# Patient Record
Sex: Female | Born: 1985
Health system: Southern US, Community
[De-identification: ages and names within clinical notes are randomized; demographics above are authoritative.]

## PROBLEM LIST (undated history)

## (undated) DIAGNOSIS — Z789 Other specified health status: Secondary | ICD-10-CM

## (undated) DIAGNOSIS — IMO0001 Reserved for inherently not codable concepts without codable children: Secondary | ICD-10-CM

## (undated) DIAGNOSIS — N83209 Unspecified ovarian cyst, unspecified side: Secondary | ICD-10-CM

## (undated) HISTORY — DX: Reserved for inherently not codable concepts without codable children: IMO0001

## (undated) HISTORY — DX: Unspecified ovarian cyst, unspecified side: N83.209

## (undated) HISTORY — PX: APPENDECTOMY: SHX54

## (undated) HISTORY — PX: WRIST SURGERY: SHX841

## (undated) HISTORY — PX: WISDOM TOOTH EXTRACTION: SHX21

---
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
## 2008-08-03 ENCOUNTER — Emergency Department (HOSPITAL_COMMUNITY): Admission: EM | Admit: 2008-08-03 | Discharge: 2008-08-03 | Payer: Self-pay | Admitting: Family Medicine

## 2010-01-12 ENCOUNTER — Ambulatory Visit (HOSPITAL_COMMUNITY): Admission: RE | Admit: 2010-01-12 | Discharge: 2010-01-12 | Payer: Self-pay | Admitting: Obstetrics and Gynecology

## 2010-04-25 ENCOUNTER — Inpatient Hospital Stay (HOSPITAL_COMMUNITY)
Admission: AD | Admit: 2010-04-25 | Discharge: 2010-04-28 | Payer: Self-pay | Source: Home / Self Care | Admitting: Obstetrics and Gynecology

## 2010-11-25 LAB — CBC
HCT: 29 % — ABNORMAL LOW (ref 36.0–46.0)
HCT: 35.9 % — ABNORMAL LOW (ref 36.0–46.0)
Hemoglobin: 12.1 g/dL (ref 12.0–15.0)
Hemoglobin: 9.9 g/dL — ABNORMAL LOW (ref 12.0–15.0)
MCH: 29.5 pg (ref 26.0–34.0)
MCH: 30.2 pg (ref 26.0–34.0)
MCHC: 33.8 g/dL (ref 30.0–36.0)
MCHC: 34.2 g/dL (ref 30.0–36.0)
MCV: 87.2 fL (ref 78.0–100.0)
MCV: 88.5 fL (ref 78.0–100.0)
Platelets: 241 10*3/uL (ref 150–400)
Platelets: 294 10*3/uL (ref 150–400)
RBC: 3.28 MIL/uL — ABNORMAL LOW (ref 3.87–5.11)
RBC: 4.12 MIL/uL (ref 3.87–5.11)
RDW: 14 % (ref 11.5–15.5)
RDW: 14.4 % (ref 11.5–15.5)
WBC: 14.6 10*3/uL — ABNORMAL HIGH (ref 4.0–10.5)
WBC: 8.3 10*3/uL (ref 4.0–10.5)

## 2010-11-25 LAB — RPR: RPR Ser Ql: NONREACTIVE

## 2011-01-27 NOTE — Discharge Summary (Signed)
[facility]  Patient:    Ashley Baldwin, Ashley Baldwin Visit Number: [id number] MRN: [id number]          Service Type: PED Location: PEDS [phone] Attending Physician:  Leonia Corona Dictated by:   Judie Petit. Leonia Corona, M.D. Admit Date:  04/16/2001 Discharge Date: 04/20/2001                             Discharge Summary  IDENTIFYING DATA:  A 25 year old female child.  ADMISSION DIAGNOSIS:  Right lower quadrant abdominal pain with possible appendicitis.  POSTOPERATIVE DIAGNOSIS:  Appendicular mass.  PROCEDURE PERFORMED:  Exploratory laparotomy with appendectomy.  BRIEF HISTORY AND PHYSICAL:  This 25 year old female patient was seen by Dr. Leeanne Mannan in his office for right lower quadrant abdominal pain of two-day duration. Subsequently, she was sent for CAT scan which showed appendicular mass for which she was admitted and reassessed. Physical examination was significant only for minimally tender right lower quadrant of the abdominal wall. Her past medical history was unremarkable. There was no dysuria, no menorrhagia, or dysmenorrhea. Based on the CAT scan findings of appendicular mass, which was acutely tender, we decided we would take the patient to the operating room for exploration and possible appendectomy, which was performed the same evening under general anesthesia. This was a very densely adherent appendix in appendicular phlegmon which was removed and then the peritoneum was irrigated and closed. The patient was sent to the pediatric floor for post appendectomy care where she remained n.p.o. for two days, and the postoperative recovery and course during the hospital stay was unremarkable. She was started with oral liquids after 24 hours which was gradually advanced. She was ambulatory the next morning. On the day of discharge, the third postoperative day, she was afebrile, minimally tender over the surgical incision. The dressing was clean and dry.  She was able to tolerate soft diet and was ambulatory and she was sent home with antibiotics and pain medication with instruction to follow up in Dr. Rosalee Kaufman office in 10 days.  DISCHARGE MEDICATIONS:  Augmentin 875 mg p.o. b.i.d. for seven days.  DIET:  Regular.  DISCHARGE INSTRUCTIONS:  The patient is to avoid heavy exercise for three weeks. Dictated by:   Judie Petit. Leonia Corona, M.D. Attending Physician:  Leonia Corona DD:  05/27/01 TD:  05/27/01 Job: [id number]

## 2011-01-27 NOTE — Op Note (Signed)
[place]  Patient:    Ashley Baldwin, Ashley Baldwin                    MRN: [id number] Proc. Date: 04/26/01 Adm. Date:  [id number] Disc. Date: [id number] Attending:  Leonia Corona CC:         Velora Heckler, M.D.  Maida Sale, P.A., [facility], [address] [place] [address]   Operative Report  PATIENT IDENTIFICATION:  A 25 year old female child.  PREOPERATIVE DIAGNOSIS:   Appendicular mass.  POSTOPERATIVE DIAGNOSIS:  Complex appendicular mass.  OPERATION:  Open appendectomy.  SURGEON:  Nelida Meuse, M.D.  ASSISTANT:  Velora Heckler, M.D.  ANESTHESIA:  General endotracheal tube anesthesia.  INDICATIONS FOR THE PROCEDURE:  This 25 year old severely obese girl presented with right lower quadrant abdominal pain where clinical examination was equivocal for acute appendicitis.  A CT scan was obtained which showed a very complex, tender mass in the region of the ileocecal junction without well delineating appendix.  In view of the high white count and acutely tender mass, we decided to do an open appendectomy as the procedure.  PROCEDURE IN DETAIL:  The patient was brought to the operating room and placed supine on the operating table.  General endotracheal tube anesthesia is given. The right lower quadrant of the abdomen and surrounding area was cleaned, prepped, and draped in the usual manner.  The mass was palpated before making the right lower quadrant.  The incision was placed, centered at [facility] point.  Curvilinear skin crease incision was made.  Initially the incision was about 7 to 8 cm long on the skin.  The incision was deepened through the subcutaneous tissue using electrocautery.  A very thick pad of fat, several inches thick, had to be split with the help of blades of retractors until the external aponeurotic fascia was reached which was incised along the line of its fibers.  Then the internal oblique and transverse  abdominus muscle fibers were split along the fibers by inserting the blunt hemostat and stretching the fibers and pulling them apart with the help of blades of [employer] retractors. Upon visualizing the peritoneum, further stretching of the muscle fiber was done, and a limited access was obtained to the peritoneal cavity by dividing the peritoneum between two clamps and dividing it along the line of incision. Then, retractors were inserted inside the peritoneal cavity, and the mass was visualized.  It was a very hard, thickened mass measuring more than 10 cm transversely as well as vertically.  Finger dissection was attempted through this limited incision; however, it apparently did not appear to make any headway due to a very firm consistency and unidentifiable structures.  There was a very large tubular structure adherent to the lateral wall of the parietal peritoneum on the right side which lay behind the cecum which was already thickened, possibly appearing as tubular structure as an appendix. However, with this limited access, further progression was impossible.  I decided to divide the muscle.  The internal oblique and transverse abdominus muscles were divided with the help of electrocautery medially as well as laterally to gain more access; so this muscle splitting McBurney incision was converted into muscle cutting Rockey-Davis incision, and a better access was obtained.  With the help of a finger dissection, the adherent mass was separated from the lateral wall, and our finger was passed above the mass to reach the end of the tubular structure which was palpably cystic and  appeared to have a blind end indicating possibility of this being the appendix. However, the diameter was too wide, more than 2 cm, to easily accept this assumption.  We continued our blunt dissection with fingers and peanuts behind this assumed appendicular structure where another tubular structure was  identified which appeared like a small bowel, a very unusually placed loop of small bowel behind the appendix in the retroperitoneal area was noticed.  We were able to continue slowly but steadily dissecting the appendicular structure up until the base.  The entire appendix was more than 10 cm long and more than 4 cm in diameter.  After completing the dissection of the appendix up to the base, the mesoappendix was divided with electrocautery which had already thrombosed vessels.  At the base of the appendix, the communication with the cecum was very wide, more than 2 to 3 cm.  Therefore, we decided to use a TIA stapler which was fired at the base, and then the appendix was divided and removed from the field.  The rest of the structures were left without any further dissection.  The cecal wall was very thickened and appeared as it it were calcified.  Medially, the mass appeared to be conglomeration of the terminal ileum, cecal wall, and much of the omentum; however, there was no obvious obstruction.  We, therefore, decided to do no further dissection of the mass. Local irrigation with warm saline was done, and oozing and bleeding spots were cauterized.  Then the abdominal cavity was closed in multiple layers, the peritoneum with 0 Vicryl continuous stitches.  The muscles were approximated with 0 Vicryl interrupted stitches, and the external aponeurosis was repaired with interrupted sutures using 0 Vicryl.  The wound was irrigated once again. The skin was closed with #1 nylon interrupted stitches.  The patient tolerated the procedure very well which was smooth and uneventful.  The patient was later extubated and transported to the recovery room in good and stable condition. DD:  04/26/01 TD:  04/27/01 Job: [id number]

## 2012-11-04 ENCOUNTER — Encounter: Payer: Self-pay | Admitting: Family Medicine

## 2012-11-04 ENCOUNTER — Ambulatory Visit (INDEPENDENT_AMBULATORY_CARE_PROVIDER_SITE_OTHER): Payer: 59 | Admitting: Family Medicine

## 2012-11-04 DIAGNOSIS — Z0000 Encounter for general adult medical examination without abnormal findings: Secondary | ICD-10-CM

## 2012-11-04 DIAGNOSIS — Z136 Encounter for screening for cardiovascular disorders: Secondary | ICD-10-CM

## 2012-11-04 DIAGNOSIS — L659 Nonscarring hair loss, unspecified: Secondary | ICD-10-CM

## 2012-11-04 LAB — LIPID PANEL
LDL Cholesterol: 90 mg/dL (ref 0–99)
Total CHOL/HDL Ratio: 3
VLDL: 7.6 mg/dL (ref 0.0–40.0)

## 2012-11-04 LAB — CBC WITH DIFFERENTIAL/PLATELET
Basophils Relative: 0.4 % (ref 0.0–3.0)
Eosinophils Relative: 1.8 % (ref 0.0–5.0)
HCT: 36.4 % (ref 36.0–46.0)
Hemoglobin: 12.1 g/dL (ref 12.0–15.0)
Lymphs Abs: 2.4 10*3/uL (ref 0.7–4.0)
Monocytes Relative: 8.6 % (ref 3.0–12.0)
Neutro Abs: 2.8 10*3/uL (ref 1.4–7.7)
WBC: 5.8 10*3/uL (ref 4.5–10.5)

## 2012-11-04 LAB — COMPREHENSIVE METABOLIC PANEL
AST: 17 U/L (ref 0–37)
Albumin: 3.8 g/dL (ref 3.5–5.2)
Alkaline Phosphatase: 61 U/L (ref 39–117)
BUN: 10 mg/dL (ref 6–23)
Potassium: 3.6 mEq/L (ref 3.5–5.1)
Sodium: 139 mEq/L (ref 135–145)
Total Protein: 7.8 g/dL (ref 6.0–8.3)

## 2012-11-04 LAB — T4, FREE: Free T4: 0.84 ng/dL (ref 0.60–1.60)

## 2012-11-04 NOTE — Progress Notes (Signed)
Subjective:    Patient ID: Ashley Baldwin, female    DOB: 03-18-1986, 27 y.o.   MRN: [id number]  HPI  Very pleasant 27 yo female here to establish care.  Weight gain- has gained 20-30 pounds over past 6 months yet she is actually eating better and exercising more regularly. Weighed 215 prior to birth of her 68 year old daughter.  Has implanon for [place].  Weight: 255 lb (115.667 kg)   She has noticed some increased fatigue and patches of her hair falling out.  No changes in her bowel habits.  No family h/o thyroid dysfunction that she is aware of.  Sees OBGYN, Dr. Ellyn Hack, last pap smear was in 11/2011.  No h/o abnormal pap smears.  Patient Active Problem List  Diagnosis  . Severe obesity (BMI >= 40)  . Alopecia   No past medical history on file. Past Surgical History  Procedure Laterality Date  . Appendectomy     History  Substance Use Topics  . Smoking status: Never Smoker   . Smokeless tobacco: Not on file  . Alcohol Use: Not on file   Family History  Problem Relation Age of Onset  . Hypertension Mother   . Diabetes Father   . Hypertension Father   . Cancer Maternal Grandfather     prostate CA   No Known Allergies No current outpatient prescriptions on file prior to visit.   No current facility-administered medications on file prior to visit.   The PMH, PSH, Social History, Family History, Medications, and allergies have been reviewed in [facility], and have been updated if relevant.    Review of Systems    See HPI No depression No CP  No SOB  Objective:   Physical Exam BP 118/80  Pulse 80  Temp(Src) 98.2 F (36.8 C)  Ht 5' 2.5" (1.588 m)  Wt 255 lb (115.667 kg)  BMI 45.87 kg/m2  General:  Obese, Well-developed,well-nourished,in no acute distress; alert,appropriate and cooperative throughout examination Head:  normocephalic and atraumatic.   Eyes:  vision grossly intact, pupils equal, pupils round, and pupils reactive to light.   Ears:  R ear normal  and L ear normal.   Nose:  no external deformity.   Mouth:  good dentition.   Neck:  No deformities, masses, or tenderness noted. Lungs:  Normal respiratory effort, chest expands symmetrically. Lungs are clear to auscultation, no crackles or wheezes. Heart:  Normal rate and regular rhythm. S1 and S2 normal without gallop, murmur, click, rub or other extra sounds. Abdomen:  Bowel sounds positive,abdomen soft and non-tender without masses, organomegaly or hernias noted. Msk:  No deformity or scoliosis noted of thoracic or lumbar spine.   Extremities:  No clubbing, cyanosis, edema, or deformity noted with normal full range of motion of all joints.   Neurologic:  alert & oriented X3 and gait normal.   Skin:  Several patches of alopecia on scalp Cervical Nodes:  No lymphadenopathy noted Axillary Nodes:  No palpable lymphadenopathy Psych:  Cognition and judgment appear intact. Alert and cooperative with normal attention span and concentration. No apparent delusions, illusions, hallucinations  Assessment and Plan: 1. Severe obesity (BMI >= 40) Deteriorated.  Will check labs to rule thyroid dysfunction and other possible factors.  Discussed diet (food journal) and exercise.  Would consider trial of phentermine if labs ok- discussed risks including, HTN, pulmonary HTN. - TSH - T4, Free - CBC with Differential - Comprehensive metabolic panel  2. Screening for ischemic heart disease  - Lipid  Panel   3. Alopecia See above.  If labs normal, refer to derm. The patient indicates understanding of these issues and agrees with the plan.

## 2012-11-04 NOTE — Patient Instructions (Addendum)
It was really nice to meet you. We will call you with your lab results.

## 2012-11-05 ENCOUNTER — Encounter: Payer: Self-pay | Admitting: *Deleted

## 2012-12-17 ENCOUNTER — Encounter: Payer: Self-pay | Admitting: Family Medicine

## 2013-01-14 ENCOUNTER — Encounter: Payer: Self-pay | Admitting: Family Medicine

## 2013-01-14 ENCOUNTER — Ambulatory Visit (INDEPENDENT_AMBULATORY_CARE_PROVIDER_SITE_OTHER): Payer: 59 | Admitting: Family Medicine

## 2013-01-14 MED ORDER — PHENTERMINE HCL 15 MG PO CAPS: 15.0000 mg | ORAL_CAPSULE | ORAL | Status: DC

## 2013-01-14 NOTE — Patient Instructions (Signed)
Good to see you. Please take phentermine in the morning, daily, no later than 11 am. Please call me immediately if you have any CP, SOB or palpitations. Follow up with me in 1 month, sooner if you have any symptoms we discussed today.

## 2013-01-14 NOTE — Progress Notes (Signed)
  Subjective:    Patient ID: Ashley Baldwin, female    DOB: 1986/06/23, 27 y.o.   MRN: [id number]  HPI  Very pleasant 27 yo female here to follow up obesity.  When she established care in February, we discussed her struggle with obesity.  Weight gain- has gained 20-30 pounds over past 9 yet she is actually eating better and exercising more regularly. Weighed 215 prior to birth of her 72 year old daughter.  Wt Readings from Last 3 Encounters:  01/14/13 258 lb 8 oz (117.255 kg)  11/04/12 255 lb (115.667 kg)     Has implanon for [facility].     Lab Results  Component Value Date   TSH 1.35 11/04/2012   Lab Results  Component Value Date   NA 139 11/04/2012   K 3.6 11/04/2012   CL 105 11/04/2012   CO2 27 11/04/2012   Lab Results  Component Value Date   WBC 5.8 11/04/2012   HGB 12.1 11/04/2012   HCT 36.4 11/04/2012   MCV 85.8 11/04/2012   PLT 350.0 11/04/2012       Patient Active Problem List   Diagnosis Date Noted  . Severe obesity (BMI >= 40) 11/04/2012  . Alopecia 11/04/2012   No past medical history on file. Past Surgical History  Procedure Laterality Date  . Appendectomy     History  Substance Use Topics  . Smoking status: Never Smoker   . Smokeless tobacco: Not on file  . Alcohol Use: Not on file   Family History  Problem Relation Age of Onset  . Hypertension Mother   . Diabetes Father   . Hypertension Father   . Cancer Maternal Grandfather     prostate CA   No Known Allergies Current Outpatient Prescriptions on File Prior to Visit  Medication Sig Dispense Refill  . Multiple Vitamin (MULTIVITAMIN) tablet Take 1 tablet by mouth daily.       No current facility-administered medications on file prior to visit.   The PMH, PSH, Social History, Family History, Medications, and allergies have been reviewed in [facility], and have been updated if relevant.    Review of Systems    See HPI No depression No CP  No SOB  Objective:   Physical Exam BP 120/80  Pulse 76   Temp(Src) 98.4 F (36.9 C) (Oral)  Wt 258 lb 8 oz (117.255 kg)  BMI 46.5 kg/m2  LMP 12/22/2012  General:  Obese, Well-developed,well-nourished,in no acute distress; alert,appropriate and cooperative throughout examination Head:  normocephalic and atraumatic.   Eyes:  vision grossly intact, pupils equal, pupils round, and pupils reactive to light.   Ears:  R ear normal and L ear normal.   Nose:  no external deformity.   Mouth:  good dentition.   Neck:  No deformities, masses, or tenderness noted. Lungs:  Normal respiratory effort, chest expands symmetrically. Lungs are clear to auscultation, no crackles or wheezes. Heart:  Normal rate and regular rhythm. S1 and S2 normal without gallop, murmur, click, rub or other extra sounds. Psych:  Cognition and judgment appear intact. Alert and cooperative with normal attention span and concentration. No apparent delusions, illusions, hallucinations  Assessment and Plan: 1. Severe obesity (BMI >= 40) Deteriorated.  Discussed diet (food journal) and exercise.  VSS, normotensive. Discussed risks of phentermine including, HTN, CVA and pulmonary HTN.  She would like to try it. Rx given.  Follow up in one month.

## 2013-02-17 ENCOUNTER — Ambulatory Visit (INDEPENDENT_AMBULATORY_CARE_PROVIDER_SITE_OTHER): Payer: 59 | Admitting: Family Medicine

## 2013-02-17 ENCOUNTER — Encounter: Payer: Self-pay | Admitting: Family Medicine

## 2013-02-17 DIAGNOSIS — L659 Nonscarring hair loss, unspecified: Secondary | ICD-10-CM

## 2013-02-17 MED ORDER — PHENTERMINE HCL 30 MG PO CAPS: ORAL_CAPSULE | ORAL | Status: DC

## 2013-02-17 NOTE — Patient Instructions (Addendum)
Good to see you. Congratulations on your weight loss- keep working on it. Continue with food journal.  Please follow up in 1 month.  Please stop by to see Shirlee Limerick on your way out to set up your referral.

## 2013-02-17 NOTE — Progress Notes (Signed)
  Subjective:    Patient ID: Ashley Baldwin, female    DOB: [birth year], 27 y.o.   MRN: [id number]  HPI  Very pleasant 27 yo female here to follow up obesity.    This has been a lifelong struggle for [name].  Last month, we started phentermine 15 mg daily. Denies any HA, palpitations, blurred vision, CP or SOB.  She has lost 2 pounds.  According to her home scale, has lost 4 pounds.  She has noticed decreased appetite.   Has implanon for [facility].     Lab Results  Component Value Date   TSH 1.35 11/04/2012   Lab Results  Component Value Date   NA 139 11/04/2012   K 3.6 11/04/2012   CL 105 11/04/2012   CO2 27 11/04/2012   Lab Results  Component Value Date   WBC 5.8 11/04/2012   HGB 12.1 11/04/2012   HCT 36.4 11/04/2012   MCV 85.8 11/04/2012   PLT 350.0 11/04/2012    Still having issues with hair loss and would like referral to dermatologist.   Patient Active Problem List   Diagnosis Date Noted  . Severe obesity (BMI >= 40) 11/04/2012  . Alopecia 11/04/2012   No past medical history on file. Past Surgical History  Procedure Laterality Date  . Appendectomy     History  Substance Use Topics  . Smoking status: Never Smoker   . Smokeless tobacco: Never Used  . Alcohol Use: Yes     Comment: occassionally   Family History  Problem Relation Age of Onset  . Hypertension Mother   . Diabetes Father   . Hypertension Father   . Cancer Maternal Grandfather     prostate CA   No Known Allergies Current Outpatient Prescriptions on File Prior to Visit  Medication Sig Dispense Refill  . Multiple Vitamin (MULTIVITAMIN) tablet Take 1 tablet by mouth daily.      . phentermine 15 MG capsule Take 1 capsule (15 mg total) by mouth every morning.  30 capsule  0   No current facility-administered medications on file prior to visit.   The PMH, PSH, Social History, Family History, Medications, and allergies have been reviewed in [facility], and have been updated if relevant.    Review of  Systems    See HPI No depression No CP  No SOB  Objective:   Physical Exam Wt Readings from Last 3 Encounters:  02/17/13 256 lb (116.121 kg)  01/14/13 258 lb 8 oz (117.255 kg)  11/04/12 255 lb (115.667 kg)    General:  Obese, Well-developed,well-nourished,in no acute distress; alert,appropriate and cooperative throughout examination Head:  normocephalic and atraumatic.   Eyes:  vision grossly intact, pupils equal, pupils round, and pupils reactive to light.   Ears:  R ear normal and L ear normal.   Nose:  no external deformity.   Mouth:  good dentition.   Neck:  No deformities, masses, or tenderness noted. Lungs:  Normal respiratory effort, chest expands symmetrically. Lungs are clear to auscultation, no crackles or wheezes. Heart:  Normal rate and regular rhythm. S1 and S2 normal without gallop, murmur, click, rub or other extra sounds. Psych:  Cognition and judgment appear intact. Alert and cooperative with normal attention span and concentration. No apparent delusions, illusions, hallucinations  Assessment and Plan: 1. Severe obesity (BMI >= 40) Improving.  Increase phentermine to 30 mg daily. Follow up in 1 month.  2.  Alopecia- Referral placed to derm.

## 2013-03-19 ENCOUNTER — Encounter: Payer: Self-pay | Admitting: Family Medicine

## 2013-03-19 ENCOUNTER — Ambulatory Visit (INDEPENDENT_AMBULATORY_CARE_PROVIDER_SITE_OTHER): Payer: 59 | Admitting: Family Medicine

## 2013-03-19 MED ORDER — PHENTERMINE HCL 37.5 MG PO CAPS: 37.5000 mg | ORAL_CAPSULE | ORAL | Status: DC

## 2013-03-19 NOTE — Patient Instructions (Addendum)
Good to see you. Congratulations on your weight loss- keep working on it. Continue with food journal.  Please follow up in 1 month.

## 2013-03-19 NOTE — Progress Notes (Signed)
Subjective:    Patient ID: Ashley Baldwin, female    DOB: 1986-03-08, 27 y.o.   MRN: [id number]  HPI  Very pleasant 27 yo female here to follow up obesity.    This has been a lifelong struggle for Ashley Baldwin.  Increased phentermine to 30 mg daily last month.   Denies any HA, palpitations, blurred vision, CP or SOB. Has lost 5 pounds since last visit! Wt Readings from Last 3 Encounters:  03/19/13 251 lb (113.853 kg)  02/17/13 256 lb (116.121 kg)  01/14/13 258 lb 8 oz (117.255 kg)   She has noticed decreased appetite. Starting to exercise.  Has implanon for [facility].     Lab Results  Component Value Date   TSH 1.35 11/04/2012   Lab Results  Component Value Date   NA 139 11/04/2012   K 3.6 11/04/2012   CL 105 11/04/2012   CO2 27 11/04/2012   Lab Results  Component Value Date   WBC 5.8 11/04/2012   HGB 12.1 11/04/2012   HCT 36.4 11/04/2012   MCV 85.8 11/04/2012   PLT 350.0 11/04/2012    Still having issues with hair loss and would like referral to dermatologist.   Patient Active Problem List   Diagnosis Date Noted  . Severe obesity (BMI >= 40) 11/04/2012  . Alopecia 11/04/2012   No past medical history on file. Past Surgical History  Procedure Laterality Date  . Appendectomy     History  Substance Use Topics  . Smoking status: Never Smoker   . Smokeless tobacco: Never Used  . Alcohol Use: Yes     Comment: occassionally   Family History  Problem Relation Age of Onset  . Hypertension Mother   . Diabetes Father   . Hypertension Father   . Cancer Maternal Grandfather     prostate CA   No Known Allergies Current Outpatient Prescriptions on File Prior to Visit  Medication Sig Dispense Refill  . Multiple Vitamin (MULTIVITAMIN) tablet Take 1 tablet by mouth daily.      . phentermine 30 MG capsule 1 tab by mouth every morning.  30 capsule  0   No current facility-administered medications on file prior to visit.   The PMH, PSH, Social History, Family History,  Medications, and allergies have been reviewed in [facility], and have been updated if relevant.    Review of Systems    See HPI No depression No CP  No SOB  Objective:   Physical Exam BP 110/72  Pulse 80  Temp(Src) 98.2 F (36.8 C)  Wt 251 lb (113.853 kg)  BMI 45.15 kg/m2  Wt Readings from Last 3 Encounters:  02/17/13 256 lb (116.121 kg)  01/14/13 258 lb 8 oz (117.255 kg)  11/04/12 255 lb (115.667 kg)    General:  Obese, Well-developed,well-nourished,in no acute distress; alert,appropriate and cooperative throughout examination Head:  normocephalic and atraumatic.   Eyes:  vision grossly intact, pupils equal, pupils round, and pupils reactive to light.   Ears:  R ear normal and L ear normal.   Nose:  no external deformity.   Mouth:  good dentition.   Neck:  No deformities, masses, or tenderness noted. Lungs:  Normal respiratory effort, chest expands symmetrically. Lungs are clear to auscultation, no crackles or wheezes. Heart:  Normal rate and regular rhythm. S1 and S2 normal without gallop, murmur, click, rub or other extra sounds. Psych:  Cognition and judgment appear intact. Alert and cooperative with normal attention span and concentration. No apparent delusions, illusions, hallucinations  Assessment and Plan: 1. Severe obesity (BMI >= 40) Improving.  Increase phentermine to 37.5 mg daily. Follow up in 1 month.

## 2013-04-25 ENCOUNTER — Ambulatory Visit: Payer: 59 | Admitting: Family Medicine

## 2013-04-25 DIAGNOSIS — Z0289 Encounter for other administrative examinations: Secondary | ICD-10-CM

## 2013-07-17 ENCOUNTER — Other Ambulatory Visit: Payer: Self-pay

## 2014-03-16 ENCOUNTER — Encounter: Payer: Self-pay | Admitting: Family Medicine

## 2014-03-16 ENCOUNTER — Ambulatory Visit: Payer: 59 | Admitting: Family Medicine

## 2014-03-16 ENCOUNTER — Ambulatory Visit (INDEPENDENT_AMBULATORY_CARE_PROVIDER_SITE_OTHER): Payer: 59 | Admitting: Family Medicine

## 2014-03-16 VITALS — BP 106/70 | HR 78 | Temp 98.6°F | Ht 62.25 in | Wt 280.5 lb

## 2014-03-16 DIAGNOSIS — J069 Acute upper respiratory infection, unspecified: Secondary | ICD-10-CM | POA: Insufficient documentation

## 2014-03-16 MED ORDER — HYDROCOD POLST-CHLORPHEN POLST 10-8 MG/5ML PO LQCR: 5.0000 mL | Freq: Every evening | ORAL | Status: DC | PRN

## 2014-03-16 MED ORDER — AZITHROMYCIN 250 MG PO TABS: ORAL_TABLET | ORAL | Status: DC

## 2014-03-16 NOTE — Patient Instructions (Signed)
Good to see you. You likely have bronchitis. Take zpack as directed. Call me if you decide you want an inhaler.  Drink warm tea with honey.  Tussionex as needed at bedtime.

## 2014-03-16 NOTE — Progress Notes (Signed)
Pre visit review using our clinic review tool, if applicable. No additional management support is needed unless otherwise documented below in the visit note. 

## 2014-03-16 NOTE — Progress Notes (Signed)
SUBJECTIVE:  Ashley Baldwin is a 28 y.o. female who complains of coryza, congestion, dry cough, sneezing and sore throat for 10 days. She denies a history of anorexia, chest pain, chills and shortness of breath and has a history of asthma. Patient denies smoke cigarettes.   Current Outpatient Prescriptions on File Prior to Visit  Medication Sig Dispense Refill  . Multiple Vitamin (MULTIVITAMIN) tablet Take 1 tablet by mouth daily.       No current facility-administered medications on file prior to visit.    No Known Allergies  No past medical history on file.  Past Surgical History  Procedure Laterality Date  . Appendectomy      Family History  Problem Relation Age of Onset  . Hypertension Mother   . Diabetes Father   . Hypertension Father   . Cancer Maternal Grandfather     prostate CA    History   Social History  . Marital Status: Single    Spouse Name: N/A    Number of Children: N/A  . Years of Education: N/A   Occupational History  . Not on file.   Social History Main Topics  . Smoking status: Never Smoker   . Smokeless tobacco: Never Used  . Alcohol Use: Yes     Comment: occassionally  . Drug Use: No  . Sexual Activity: Not on file   Other Topics Concern  . 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   One child, 9 year old daughter.   Lives with her mother and daughter.     The PMH, PSH, Social History, Family History, Medications, and allergies have been reviewed in [facility], and have been updated if relevant.  OBJECTIVE: BP 106/70  Pulse 78  Temp(Src) 98.6 F (37 C) (Oral)  Ht 5' 2.25" (1.581 m)  Wt 280 lb 8 oz (127.234 kg)  BMI 50.90 kg/m2  SpO2 98%  LMP 02/27/2014  She appears well, vital signs are as noted. Ears normal.  Throat and pharynx normal.  Neck supple. No adenopathy in the neck. Nose is congested. Sinuses non tender.  Scattered exp wheezes  ASSESSMENT:  bronchitis  PLAN: Given  duration and progression of symptoms, will treat for bacterial process with Zpack, tussionex as needed for cough.  Symptomatic therapy suggested: push fluids, rest and return office visit prn if symptoms persist or worsen. . Call or return to clinic prn if these symptoms worsen or fail to improve as anticipated.

## 2014-09-22 ENCOUNTER — Ambulatory Visit (INDEPENDENT_AMBULATORY_CARE_PROVIDER_SITE_OTHER): Payer: Medicare Other | Admitting: Internal Medicine

## 2014-09-22 ENCOUNTER — Telehealth: Payer: Self-pay | Admitting: Family Medicine

## 2014-09-22 ENCOUNTER — Other Ambulatory Visit: Payer: Medicare Other

## 2014-09-22 ENCOUNTER — Encounter: Payer: Self-pay | Admitting: Internal Medicine

## 2014-09-22 VITALS — BP 100/70 | HR 75 | Temp 98.1°F | Ht 62.25 in | Wt 279.0 lb

## 2014-09-22 DIAGNOSIS — R300 Dysuria: Secondary | ICD-10-CM

## 2014-09-22 DIAGNOSIS — R8290 Unspecified abnormal findings in urine: Secondary | ICD-10-CM

## 2014-09-22 DIAGNOSIS — R350 Frequency of micturition: Secondary | ICD-10-CM

## 2014-09-22 LAB — POCT URINALYSIS DIPSTICK
Blood, UA: NEGATIVE
Glucose, UA: NEGATIVE
Ketones, UA: NEGATIVE
Nitrite, UA: NEGATIVE
UROBILINOGEN UA: 1
pH, UA: 6

## 2014-09-22 MED ORDER — PHENAZOPYRIDINE HCL 200 MG PO TABS: 200.0000 mg | ORAL_TABLET | Freq: Three times a day (TID) | ORAL | Status: DC | PRN

## 2014-09-22 MED ORDER — NITROFURANTOIN MONOHYD MACRO 100 MG PO CAPS: 100.0000 mg | ORAL_CAPSULE | Freq: Two times a day (BID) | ORAL | Status: DC

## 2014-09-22 NOTE — Telephone Encounter (Signed)
Macrobid was not originally electronically sent in. This has been done now. Pharmacy should have it.

## 2014-09-22 NOTE — Telephone Encounter (Signed)
Pharmacist calling about electronic refill, [phone]

## 2014-09-22 NOTE — Progress Notes (Signed)
   Subjective:    Patient ID: Ashley Baldwin, female    DOB: 25-Jan-1986, 29 y.o.   MRN: [id number]  HPI Symptoms began 1/11 as sensation of frequency and urgency. This was associated with oliguria. She has slight dysuria. Urine is subjectively malodorous. She also had flank discomfort. Chills began last night.  She's been increasing her fluid intake and using herbal tea.  She has no history of recent antibiotic use. She had recurrent urinary tract infections in early 20s.  She has no history of renal calculi; urinary tract anomaly;  cystoscopy or other GU procedures  She is not a diabetic.  Review of Systems She has not noted hematuria or pyuria.  She has no gynecologic symptoms, specifically no vaginal discharge    Objective:   Physical Exam  Pertinent or positive findings include:   As per CDC Guidelines ,Epic documents morbid obesity as being present . She has a grade 1/2 systolic murmur at the left upper sternal border. She has slight discomfort to percussion over the flanks bilaterally.   General appearance :adequately nourished; in no distress. Eyes: No conjunctival inflammation or scleral icterus is present. Oral exam: Dental hygiene is good. Lips and gums are healthy appearing.There is no oropharyngeal erythema or exudate noted.  Heart:  Normal rate and regular rhythm. S1 and S2 normal without gallop,  click, rub or other extra sounds   Lungs:Chest clear to auscultation; no wheezes, rhonchi,rales ,or rubs present.No increased work of breathing.  Abdomen: bowel sounds normal, soft and non-tender without masses, organomegaly or hernias noted.  No guarding or rebound.  Vascular : all pulses equal ; no bruits present. Skin:Warm & dry.  Intact without suspicious lesions or rashes ; no  tenting Lymphatic: No lymphadenopathy is noted about the head, neck, axilla          Assessment & Plan:  #1 frequency,dysuria, possible urinary tract infection  Plan: As per  standard of care she'll be placed on nitrofurantoin pending results of the culture and sensitivity.

## 2014-09-22 NOTE — Progress Notes (Signed)
Pre visit review using our clinic review tool, if applicable. No additional management support is needed unless otherwise documented below in the visit note. 

## 2014-09-22 NOTE — Patient Instructions (Signed)
Drink as much nondairy fluids as possible. Avoid spicy foods or alcohol as  these may aggravate the bladder / prostate. Do not take decongestants. Avoid narcotics if possible.

## 2014-09-24 LAB — URINE CULTURE
COLONY COUNT: NO GROWTH
Organism ID, Bacteria: NO GROWTH

## 2015-05-18 ENCOUNTER — Encounter: Payer: Self-pay | Admitting: Internal Medicine

## 2015-05-18 ENCOUNTER — Telehealth: Payer: Self-pay | Admitting: Family Medicine

## 2015-05-18 ENCOUNTER — Ambulatory Visit (INDEPENDENT_AMBULATORY_CARE_PROVIDER_SITE_OTHER): Payer: 59 | Admitting: Internal Medicine

## 2015-05-18 VITALS — BP 106/74 | HR 72 | Temp 98.7°F | Wt 286.0 lb

## 2015-05-18 DIAGNOSIS — R0789 Other chest pain: Secondary | ICD-10-CM

## 2015-05-18 MED ORDER — IBUPROFEN 600 MG PO TABS: 600.0000 mg | ORAL_TABLET | Freq: Three times a day (TID) | ORAL | Status: DC | PRN

## 2015-05-18 NOTE — Progress Notes (Signed)
Pre visit review using our clinic review tool, if applicable. No additional management support is needed unless otherwise documented below in the visit note. 

## 2015-05-18 NOTE — Telephone Encounter (Signed)
Pt has appt 05/18/15 at 2 pm with Webb Silversmith NP.

## 2015-05-18 NOTE — Progress Notes (Signed)
   Subjective:    Patient ID: Ashley Baldwin, female    DOB: 1986/03/01, 29 y.o.   MRN: [id number]  HPI  Ashley Baldwin is a 29 year old female who presents today with chief complaint of chest pain.  Her chest pain started 4 days ago and has continued since.  The pain is in the center of her chest and radiates to her esophogeal area.  She notices the pain at night and it wakes her up.  She describes the pain as dull and sharp pressure.  She has not taken anything for the pain, pain does not increase or begin with activity.  She denies palpitations, anxiety and stress.    Review of Systems  Constitutional: Negative for fever, diaphoresis and fatigue.  HENT: Negative for congestion, postnasal drip, sinus pressure and sore throat.   Respiratory: Positive for chest tightness and shortness of breath. Negative for cough.   Cardiovascular: Positive for chest pain. Negative for palpitations and leg swelling.  Gastrointestinal: Negative.   Genitourinary: Negative.   Musculoskeletal: Negative for myalgias, back pain and arthralgias.  Skin: Negative for color change, pallor, rash and wound.  Neurological: Negative for dizziness, light-headedness and headaches.  Psychiatric/Behavioral: Negative.    Current Outpatient Prescriptions on File Prior to Visit  Medication Sig Dispense Refill  . Norgestim-Eth Estrad Triphasic (ORTHO TRI-CYCLEN, 28, PO) Take 1 tablet by mouth daily.    . Multiple Vitamin (MULTIVITAMIN) tablet Take 1 tablet by mouth daily.     No current facility-administered medications on file prior to visit.   Family History  Problem Relation Age of Onset  . Hypertension Mother   . Diabetes Father   . Hypertension Father   . Cancer Maternal Grandfather     prostate CA       Objective:   Physical Exam  Constitutional: She is oriented to person, place, and time. She appears well-developed and well-nourished.  HENT:  Head: Normocephalic and atraumatic.  Right Ear: External ear  normal.  Left Ear: External ear normal.  Mouth/Throat: Oropharynx is clear and moist.  Neck: Normal range of motion. Neck supple. No JVD present.  Cardiovascular: Normal rate and regular rhythm.   Murmur heard. Pulmonary/Chest: Effort normal and breath sounds normal. She has no wheezes.  Abdominal: Soft. Bowel sounds are normal.  Musculoskeletal: Normal range of motion.  Lymphadenopathy:    She has no cervical adenopathy.  Neurological: She is alert and oriented to person, place, and time.  Skin: Skin is warm and dry. She is not diaphoretic.  Psychiatric: She has a normal mood and affect.          Assessment & Plan:  1. Musculoskeletal chest pain - Ibuprofen 600 mg q 6 hours for pain.  Instructed patient to take with food, in case there is a GERD element to the chest pain.  Instructed patient to call office if pain gets worse.

## 2015-05-18 NOTE — Progress Notes (Signed)
Subjective:    Patient ID: Ashley Baldwin, female    DOB: [birth year], 29 y.o.   MRN: [id number]  HPI  Pt presents to the clinic today with c/o chest pain. This started last Thursday. It was intermittent but became more constant yesterday. She describes the pain as tightness. She feels like she has been "punched in the chest". The pain does not radiated. She has had some mild SOB. She does feel like the pain is worse at night. She denies palpitations, increase in stress or anxiety. She has no history of reflux or heartburn. She has not tried anything OTC.  Review of Systems      History reviewed. No pertinent past medical history.  Current Outpatient Prescriptions  Medication Sig Dispense Refill  . Norgestim-Eth Estrad Triphasic (ORTHO TRI-CYCLEN, 28, PO) Take 1 tablet by mouth daily.    . Multiple Vitamin (MULTIVITAMIN) tablet Take 1 tablet by mouth daily.     No current facility-administered medications for this visit.    No Known Allergies  Family History  Problem Relation Age of Onset  . Hypertension Mother   . Diabetes Father   . Hypertension Father   . Cancer Maternal Grandfather     prostate CA    Social History   Social History  . Marital Status: Single    Spouse Name: N/A  . Number of Children: N/A  . Years of Education: N/A   Occupational History  . Not on file.   Social History Main Topics  . Smoking status: Never Smoker   . Smokeless tobacco: Never Used  . Alcohol Use: 0.0 oz/week    0 Standard drinks or equivalent per week     Comment: occassionally  . Drug Use: No  . Sexual Activity: Not on file   Other Topics Concern  . 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   One child, 15 year old daughter.   Lives with her mother and daughter.       Constitutional: Denies fever, malaise, fatigue, headache or abrupt weight changes.  HEENT: Denies eye pain, eye redness, ear pain, ringing in the ears, wax  buildup, runny nose, nasal congestion, bloody nose, or sore throat. Respiratory: Denies difficulty breathing, shortness of breath, cough or sputum production.   Cardiovascular: Pt reports chest tightness. Denies chest pain, palpitations or swelling in the hands or feet.  Gastrointestinal: Denies abdominal pain, bloating, constipation, diarrhea or blood in the stool.  Psych: Denies anxiety, depression, SI/HI.  No other specific complaints in a complete review of systems (except as listed in HPI above).  Objective:   Physical Exam  BP 106/74 mmHg  Pulse 72  Temp(Src) 98.7 F (37.1 C) (Oral)  Wt 286 lb (129.729 kg)  SpO2 98%  LMP 05/14/2015 Wt Readings from Last 3 Encounters:  05/18/15 286 lb (129.729 kg)  09/22/14 279 lb (126.554 kg)  03/16/14 280 lb 8 oz (127.234 kg)    General: Appears her stated age, obese in NAD. Cardiovascular: Normal rate and rhythm. S1,S2 noted.  No murmur, rubs or gallops noted.  Pulmonary/Chest: Normal effort and positive vesicular breath sounds. No respiratory distress. No wheezes, rales or ronchi noted.  Abdomen: Soft and nontender. Normal bowel sounds, no bruits noted. No distention or masses noted. Liver, spleen and kidneys non palpable. MSK: The pain is reproduced with palpation over the sternum. Neurological: Alert and oriented.     BMET    Component Value Date/Time  NA 139 11/04/2012 1122   K 3.6 11/04/2012 1122   CL 105 11/04/2012 1122   CO2 27 11/04/2012 1122   GLUCOSE 86 11/04/2012 1122   BUN 10 11/04/2012 1122   CREATININE 0.7 11/04/2012 1122   CALCIUM 8.9 11/04/2012 1122    Lipid Panel     Component Value Date/Time   CHOL 145 11/04/2012 1122   TRIG 38.0 11/04/2012 1122   HDL 47.30 11/04/2012 1122   CHOLHDL 3 11/04/2012 1122   VLDL 7.6 11/04/2012 1122   LDLCALC 90 11/04/2012 1122    CBC    Component Value Date/Time   WBC 5.8 11/04/2012 1122   RBC 4.24 11/04/2012 1122   HGB 12.1 11/04/2012 1122   HCT 36.4 11/04/2012  1122   PLT 350.0 11/04/2012 1122   MCV 85.8 11/04/2012 1122   MCH 30.2 04/27/2010 0545   MCHC 33.1 11/04/2012 1122   RDW 13.7 11/04/2012 1122   LYMPHSABS 2.4 11/04/2012 1122   MONOABS 0.5 11/04/2012 1122   EOSABS 0.1 11/04/2012 1122   BASOSABS 0.0 11/04/2012 1122    Hgb A1C No results found for: HGBA1C       Assessment & Plan:   Chest tightness secondary to chest wall pain:  ECG today normal She will try Ibuprofen 600 mg TID prn with food Showed her some stretching exercise to do Reassurance that this should would resolve with time Return precautions given  RTC as needed or if symptoms persist or worsen

## 2015-05-18 NOTE — Telephone Encounter (Signed)
Patient Name: Ashley Baldwin  DOB: 16-Oct-1985    Initial Comment Caller states she has chest pain in the center. Tightness and dull/sharp pain   Nurse Assessment  Nurse: Leilani Merl, RN, Heather Date/Time (Eastern Time): 05/18/2015 1:04:02 PM  Confirm and document reason for call. If symptomatic, describe symptoms. ---Caller states she has chest pain in the center that started last Thursday off and on until yesterday, then it became constant and it is still constant. Tightness and dull/sharp pain  Has the patient traveled out of the country within the last 30 days? ---Not Applicable  Does the patient require triage? ---Yes  Related visit to physician within the last 2 weeks? ---No  Does the PT have any chronic conditions? (i.e. diabetes, asthma, etc.) ---Yes  List chronic conditions. ---very mild heart murmur  Did the patient indicate they were pregnant? ---No     Guidelines    Guideline Title Affirmed Question Affirmed Notes  Chest Pain Chest pain lasts > 5 minutes (Exceptions: chest pain occurring > 3 days ago and now asymptomatic; same as previously diagnosed heartburn and has accompanying sour taste in mouth)    Final Disposition User   Go to ED Now (or PCP triage) Leilani Merl, RN, Nira Conn    Comments  Appt scheduled with Webb Silversmith today at 2 pm.   Referrals  REFERRED TO PCP OFFICE   Disagree/Comply: Comply

## 2015-05-18 NOTE — Patient Instructions (Signed)

## 2015-12-14 ENCOUNTER — Encounter: Payer: Self-pay | Admitting: Family Medicine

## 2015-12-14 ENCOUNTER — Ambulatory Visit (INDEPENDENT_AMBULATORY_CARE_PROVIDER_SITE_OTHER): Payer: 59 | Admitting: Family Medicine

## 2015-12-14 VITALS — BP 114/78 | HR 73 | Temp 98.7°F | Wt 293.2 lb

## 2015-12-14 DIAGNOSIS — G8929 Other chronic pain: Secondary | ICD-10-CM | POA: Diagnosis not present

## 2015-12-14 DIAGNOSIS — R1031 Right lower quadrant pain: Secondary | ICD-10-CM | POA: Diagnosis not present

## 2015-12-14 LAB — POC URINALSYSI DIPSTICK (AUTOMATED)
Bilirubin, UA: NEGATIVE
Blood, UA: NEGATIVE
Glucose, UA: NEGATIVE
KETONES UA: NEGATIVE
LEUKOCYTES UA: NEGATIVE
NITRITE UA: NEGATIVE
PH UA: 6
PROTEIN UA: NEGATIVE
Spec Grav, UA: >=1.03
Urobilinogen, UA: 0.2

## 2015-12-14 LAB — HCG, QUANTITATIVE, PREGNANCY: QUANTITATIVE HCG: 0.15 m[IU]/mL

## 2015-12-14 NOTE — Assessment & Plan Note (Signed)
New- UA neg for infection. bhcg to rule out pregnancy given associated nausea. ? Ovarian pathology given how low pain is and has remote h/o appendectomy. [place] of pelvis/Transvaginal US. The patient indicates understanding of these issues and agrees with the plan. Orders Placed This Encounter  Procedures  . US Transvaginal Non-OB  . US Pelvis Complete  . hCG, quantitative, pregnancy

## 2015-12-14 NOTE — Progress Notes (Signed)
Subjective:   Patient ID: Ashley Baldwin, female    DOB: Aug 30, 1986, 30 y.o.   MRN: [id number]  NOON SIPP is a pleasant 30 y.o. year old female who presents to clinic today with Flank Pain and Urinary Frequency  on 12/14/2015  HPI:  Right lower quadrant pain- intermittent for one month.  Has had some associated nausea, no vomiting.  Took upregnancy test last week- negative.  Compliant with ortho tri cyclen OCP.  Remote h/o appendectomy.  Has had some urinary frequency.  No dysuria.  No fevers or chills.  Pain not improved when she had her period a few weeks ago.  Walking seems to make it worse.  Current Outpatient Prescriptions on File Prior to Visit  Medication Sig Dispense Refill  . Norgestim-Eth Estrad Triphasic (ORTHO TRI-CYCLEN, 28, PO) Take 1 tablet by mouth daily.     No current facility-administered medications on file prior to visit.    No Known Allergies  No past medical history on file.  Past Surgical History  Procedure Laterality Date  . Appendectomy      Family History  Problem Relation Age of Onset  . Hypertension Mother   . Diabetes Father   . Hypertension Father   . Cancer Maternal Grandfather     prostate CA    Social History   Social History  . Marital Status: Single    Spouse Name: N/A  . Number of Children: N/A  . Years of Education: N/A   Occupational History  . Not on file.   Social History Main Topics  . Smoking status: Never Smoker   . Smokeless tobacco: Never Used  . Alcohol Use: 0.0 oz/week    0 Standard drinks or equivalent per week     Comment: occassionally  . Drug Use: No  . Sexual Activity: Not on file   Other Topics Concern  . 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   One child, 62 year old daughter.   Lives with her mother and daughter.     The PMH, PSH, Social History, Family History, Medications, and allergies have been reviewed in [facility], and have been  updated if relevant.   Review of Systems  Constitutional: Negative.   Gastrointestinal: Positive for abdominal pain and diarrhea. Negative for vomiting.  Genitourinary: Positive for urgency and pelvic pain. Negative for dysuria, frequency, vaginal discharge, enuresis, genital sores and vaginal pain.  Musculoskeletal: Negative.   Neurological: Negative.   Hematological: Negative.   Psychiatric/Behavioral: Negative.   All other systems reviewed and are negative.      Objective:    BP 114/78 mmHg  Pulse 73  Temp(Src) 98.7 F (37.1 C) (Oral)  Wt 293 lb 4 oz (133.017 kg)  SpO2 98%  LMP 11/28/2015   Physical Exam  Constitutional: She is oriented to person, place, and time. She appears well-developed and well-nourished. No distress.  HENT:  Head: Normocephalic and atraumatic.  Eyes: Conjunctivae are normal.  Cardiovascular: Normal rate.   Pulmonary/Chest: Effort normal.  Abdominal: Soft. Bowel sounds are normal. She exhibits no distension and no mass. There is no tenderness. There is no rebound and no guarding.  Musculoskeletal: Normal range of motion. She exhibits no edema.  Neurological: She is alert and oriented to person, place, and time. No cranial nerve deficit.  Skin: Skin is warm and dry. She is not diaphoretic.  Psychiatric: She has a normal mood and affect. Her behavior is normal.  Judgment and thought content normal.  Nursing note and vitals reviewed.         Assessment & Plan:   Abdominal pain, chronic, right lower quadrant - Plan: hCG, quantitative, pregnancy, US Transvaginal Non-OB, US Pelvis Complete No Follow-up on file.

## 2015-12-14 NOTE — Patient Instructions (Signed)
Good to see you. Please stop by to see Ashley Baldwin on your way out. 

## 2015-12-14 NOTE — Progress Notes (Signed)
Pre visit review using our clinic review tool, if applicable. No additional management support is needed unless otherwise documented below in the visit note. 

## 2015-12-14 NOTE — Addendum Note (Signed)
Addended by: Modena Nunnery on: 12/14/2015 11:13 AM   Modules accepted: Orders

## 2015-12-15 ENCOUNTER — Ambulatory Visit (INDEPENDENT_AMBULATORY_CARE_PROVIDER_SITE_OTHER): Payer: 59 | Admitting: Family Medicine

## 2015-12-15 ENCOUNTER — Encounter: Payer: Self-pay | Admitting: Family Medicine

## 2015-12-15 ENCOUNTER — Other Ambulatory Visit (HOSPITAL_COMMUNITY)
Admission: RE | Admit: 2015-12-15 | Discharge: 2015-12-15 | Disposition: A | Discharge status: Home / Self Care | Payer: 59 | Source: Ambulatory Visit | Attending: Family Medicine | Admitting: Family Medicine

## 2015-12-15 VITALS — BP 118/70 | HR 80 | Temp 98.3°F | Ht 62.5 in | Wt 291.5 lb

## 2015-12-15 DIAGNOSIS — G8929 Other chronic pain: Secondary | ICD-10-CM

## 2015-12-15 DIAGNOSIS — Z1151 Encounter for screening for human papillomavirus (HPV): Secondary | ICD-10-CM | POA: Diagnosis present

## 2015-12-15 DIAGNOSIS — R1031 Right lower quadrant pain: Secondary | ICD-10-CM

## 2015-12-15 DIAGNOSIS — Z01419 Encounter for gynecological examination (general) (routine) without abnormal findings: Secondary | ICD-10-CM | POA: Insufficient documentation

## 2015-12-15 DIAGNOSIS — L659 Nonscarring hair loss, unspecified: Secondary | ICD-10-CM

## 2015-12-15 LAB — CBC WITH DIFFERENTIAL/PLATELET
BASOS ABS: 0 10*3/uL (ref 0.0–0.1)
BASOS PCT: 0.5 % (ref 0.0–3.0)
EOS ABS: 0.1 10*3/uL (ref 0.0–0.7)
Eosinophils Relative: 1.1 % (ref 0.0–5.0)
HEMATOCRIT: 36 % (ref 36.0–46.0)
HEMOGLOBIN: 12.1 g/dL (ref 12.0–15.0)
LYMPHS PCT: 37.8 % (ref 12.0–46.0)
Lymphs Abs: 3.3 10*3/uL (ref 0.7–4.0)
MCHC: 33.5 g/dL (ref 30.0–36.0)
MCV: 83.7 fl (ref 78.0–100.0)
MONO ABS: 0.6 10*3/uL (ref 0.1–1.0)
Monocytes Relative: 7 % (ref 3.0–12.0)
NEUTROS ABS: 4.7 10*3/uL (ref 1.4–7.7)
Neutrophils Relative %: 53.6 % (ref 43.0–77.0)
PLATELETS: 392 10*3/uL (ref 150.0–400.0)
RBC: 4.3 Mil/uL (ref 3.87–5.11)
RDW: 13.6 % (ref 11.5–15.5)
WBC: 8.8 10*3/uL (ref 4.0–10.5)

## 2015-12-15 LAB — COMPREHENSIVE METABOLIC PANEL
ALT: 19 U/L (ref 0–35)
AST: 17 U/L (ref 0–37)
Albumin: 4 g/dL (ref 3.5–5.2)
Alkaline Phosphatase: 83 U/L (ref 39–117)
BILIRUBIN TOTAL: 0.2 mg/dL (ref 0.2–1.2)
BUN: 11 mg/dL (ref 6–23)
CALCIUM: 9.6 mg/dL (ref 8.4–10.5)
CHLORIDE: 102 meq/L (ref 96–112)
CO2: 29 meq/L (ref 19–32)
CREATININE: 0.69 mg/dL (ref 0.40–1.20)
GFR: 129.47 mL/min (ref >60.00)
Glucose, Bld: 80 mg/dL (ref 70–99)
Potassium: 3.8 mEq/L (ref 3.5–5.1)
SODIUM: 137 meq/L (ref 135–145)
Total Protein: 8.1 g/dL (ref 6.0–8.3)

## 2015-12-15 LAB — TSH: TSH: 3.16 u[IU]/mL (ref 0.35–4.50)

## 2015-12-15 LAB — LIPID PANEL
CHOL/HDL RATIO: 3
Cholesterol: 159 mg/dL (ref 0–200)
HDL: 53.7 mg/dL (ref >39.00)
LDL CALC: 87 mg/dL (ref 0–99)
NONHDL: 105.77
Triglycerides: 93 mg/dL (ref 0.0–149.0)
VLDL: 18.6 mg/dL (ref 0.0–40.0)

## 2015-12-15 NOTE — Assessment & Plan Note (Signed)
Reviewed preventive care protocols, scheduled due services, and updated immunizations Discussed nutrition, exercise, diet, and healthy lifestyle.  Pap smear done today.  Orders Placed This Encounter  Procedures  . CBC with Differential/Platelet  . Comprehensive metabolic panel  . Lipid panel  . TSH    

## 2015-12-15 NOTE — Progress Notes (Signed)
Pre visit review using our clinic review tool, if applicable. No additional management support is needed unless otherwise documented below in the visit note. 

## 2015-12-15 NOTE — Addendum Note (Signed)
Addended by: Modena Nunnery on: 12/15/2015 12:43 PM   Modules accepted: Orders

## 2015-12-15 NOTE — Progress Notes (Signed)
Subjective:   Patient ID: Ashley Baldwin, female    DOB: 06/01/1986, 30 y.o.   MRN: [id number]  Ashley Baldwin is a pleasant 30 y.o. year old female who presents to clinic today with Annual Exam  on 12/15/2015  HPI:  Last pap smear 2 years ago. No h/o abnormal pap smears.  Sexually with one partner.  Takes OCPs.  Has GYN but would like for me to take over her GYN care.  Still having pelvic pain- [place] scheduled for this Friday.  Current Outpatient Prescriptions on File Prior to Visit  Medication Sig Dispense Refill  . Norgestim-Eth Estrad Triphasic (ORTHO TRI-CYCLEN, 28, PO) Take 1 tablet by mouth daily.     No current facility-administered medications on file prior to visit.    No Known Allergies  No past medical history on file.  Past Surgical History  Procedure Laterality Date  . Appendectomy      Family History  Problem Relation Age of Onset  . Hypertension Mother   . Diabetes Father   . Hypertension Father   . Cancer Maternal Grandfather     prostate CA    Social History   Social History  . Marital Status: Single    Spouse Name: N/A  . Number of Children: N/A  . Years of Education: N/A   Occupational History  . Not on file.   Social History Main Topics  . Smoking status: Never Smoker   . Smokeless tobacco: Never Used  . Alcohol Use: 0.0 oz/week    0 Standard drinks or equivalent per week     Comment: occassionally  . Drug Use: No  . Sexual Activity: Not on file   Other Topics Concern  . 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   One child, 8 year old daughter.   Lives with her mother and daughter.     The PMH, PSH, Social History, Family History, Medications, and allergies have been reviewed in [facility], and have been updated if relevant.    Review of Systems  Constitutional: Negative.   HENT: Negative.   Eyes: Negative.   Respiratory: Negative.   Cardiovascular: Negative.     Gastrointestinal: Negative.   Endocrine: Negative.   Genitourinary: Positive for pelvic pain. Negative for dysuria, vaginal discharge, enuresis and vaginal pain.  Musculoskeletal: Negative.   Skin: Negative.   Allergic/Immunologic: Negative.   Neurological: Negative.   Hematological: Negative.   Psychiatric/Behavioral: Negative.   All other systems reviewed and are negative.      Objective:    BP 118/70 mmHg  Pulse 80  Temp(Src) 98.3 F (36.8 C) (Oral)  Ht 5' 2.5" (1.588 m)  Wt 291 lb 8 oz (132.224 kg)  BMI 52.43 kg/m2  SpO2 98%  LMP 11/28/2015   Physical Exam   General:  Well-developed,well-nourished,in no acute distress; alert,appropriate and cooperative throughout examination Head:  normocephalic and atraumatic.   Eyes:  vision grossly intact, pupils equal, pupils round, and pupils reactive to light.   Ears:  R ear normal and L ear normal.   Nose:  no external deformity.   Mouth:  good dentition.   Neck:  No deformities, masses, or tenderness noted. Breasts:  No mass, nodules, thickening, tenderness, bulging, retraction, inflamation, nipple discharge or skin changes noted.   Lungs:  Normal respiratory effort, chest expands symmetrically. Lungs are clear to auscultation, no crackles or wheezes. Heart:  Normal rate and regular rhythm. S1 and S2  normal without gallop, murmur, click, rub or other extra sounds. Abdomen:  Bowel sounds positive,abdomen soft and non-tender without masses, organomegaly or hernias noted. Rectal:  no external abnormalities.   Genitalia:  Pelvic Exam:        External: normal female genitalia without lesions or masses        Vagina: normal without lesions or masses        Cervix: normal without lesions or masses        Adnexa: normal bimanual exam without masses or fullness        Uterus: normal by palpation        Pap smear: performed Msk:  No deformity or scoliosis noted of thoracic or lumbar spine.   Extremities:  No clubbing, cyanosis,  edema, or deformity noted with normal full range of motion of all joints.   Neurologic:  alert & oriented X3 and gait normal.   Skin:  Intact without suspicious lesions or rashes Cervical Nodes:  No lymphadenopathy noted Axillary Nodes:  No palpable lymphadenopathy Psych:  Cognition and judgment appear intact. Alert and cooperative with normal attention span and concentration. No apparent delusions, illusions, hallucinations       Assessment & Plan:   Morbid obesity, unspecified obesity type ([place])  Alopecia  Abdominal pain, chronic, right lower quadrant  Well woman exam with routine gynecological exam No Follow-up on file.

## 2015-12-16 LAB — CYTOLOGY - PAP

## 2015-12-17 ENCOUNTER — Ambulatory Visit
Admission: RE | Admit: 2015-12-17 | Discharge: 2015-12-17 | Disposition: A | Discharge status: Home / Self Care | Payer: 59 | Source: Ambulatory Visit | Attending: Family Medicine | Admitting: Family Medicine

## 2015-12-17 ENCOUNTER — Encounter: Payer: Self-pay | Admitting: *Deleted

## 2015-12-17 DIAGNOSIS — R1031 Right lower quadrant pain: Principal | ICD-10-CM

## 2015-12-17 DIAGNOSIS — G8929 Other chronic pain: Secondary | ICD-10-CM

## 2015-12-20 ENCOUNTER — Other Ambulatory Visit: Payer: Self-pay | Admitting: Family Medicine

## 2015-12-20 DIAGNOSIS — N83202 Unspecified ovarian cyst, left side: Secondary | ICD-10-CM

## 2016-02-15 ENCOUNTER — Ambulatory Visit
Admission: RE | Admit: 2016-02-15 | Discharge: 2016-02-15 | Disposition: A | Discharge status: Home / Self Care | Payer: 59 | Source: Ambulatory Visit | Attending: Family Medicine | Admitting: Family Medicine

## 2016-02-15 DIAGNOSIS — N83202 Unspecified ovarian cyst, left side: Secondary | ICD-10-CM

## 2016-02-16 ENCOUNTER — Other Ambulatory Visit: Payer: Self-pay | Admitting: Internal Medicine

## 2016-02-16 DIAGNOSIS — N838 Other noninflammatory disorders of ovary, fallopian tube and broad ligament: Secondary | ICD-10-CM

## 2016-02-29 ENCOUNTER — Ambulatory Visit (INDEPENDENT_AMBULATORY_CARE_PROVIDER_SITE_OTHER): Payer: 59 | Admitting: Obstetrics and Gynecology

## 2016-02-29 ENCOUNTER — Encounter: Payer: Self-pay | Admitting: Obstetrics and Gynecology

## 2016-02-29 ENCOUNTER — Other Ambulatory Visit: Payer: Self-pay | Admitting: Obstetrics and Gynecology

## 2016-02-29 DIAGNOSIS — N83202 Unspecified ovarian cyst, left side: Secondary | ICD-10-CM | POA: Diagnosis not present

## 2016-02-29 NOTE — Patient Instructions (Signed)
Diagnostic Laparoscopy A diagnostic laparoscopy is a procedure to diagnose diseases in the abdomen. During the procedure, a thin, lighted, pencil-sized instrument called a laparoscope is inserted into the abdomen through an incision. The laparoscope allows your health care provider to look at the organs inside your body. LET [facility] CARE PROVIDER KNOW ABOUT:  Any allergies you have.  All medicines you are taking, including vitamins, herbs, eye drops, creams, and over-the-counter medicines.  Previous problems you or members of your family have had with the use of anesthetics.  Any blood disorders you have.  Previous surgeries you have had.  Medical conditions you have. RISKS AND COMPLICATIONS  Generally, this is a safe procedure. However, problems can occur, which may include:  Infection.  Bleeding.  Damage to other organs.  Allergic reaction to the anesthetics used during the procedure. BEFORE THE PROCEDURE  Do not eat or drink anything after midnight on the night before the procedure or as directed by your health care provider.  Ask your health care provider about:  Changing or stopping your regular medicines.  Taking medicines such as aspirin and ibuprofen. These medicines can thin your blood. Do not take these medicines before your procedure if your health care provider instructs you not to.  Plan to have someone take you home after the procedure. PROCEDURE  You may be given a medicine to help you relax (sedative).  You will be given a medicine to make you sleep (general anesthetic).  Your abdomen will be inflated with a gas. This will make your organs easier to see.  Small incisions will be made in your abdomen.  A laparoscope and other small instruments will be inserted into the abdomen through the incisions.  A tissue sample may be removed from an organ in the abdomen for examination.  The instruments will be removed from the abdomen.  The gas will be  released.  The incisions will be closed with stitches (sutures). AFTER THE PROCEDURE  Your blood pressure, heart rate, breathing rate, and blood oxygen level will be monitored often until the medicines you were given have worn off.   This information is not intended to replace advice given to you by your health care provider. Make sure you discuss any questions you have with your health care provider.   Document Released: 12/04/2000 Document Revised: 05/19/2015 Document Reviewed: 04/10/2014 Elsevier Interactive Patient Education [employer].

## 2016-02-29 NOTE — Progress Notes (Signed)
Subjective:     Ashley Baldwin is a 30 y.o. female G1P1 who is here for evaluation of a left ovarian cyst. Patient reports onset of left lower quadrant pain in March which prompted her first ultrasound. Follow up ultrasound in June demonstrates persistence of this left ovarian mass. Patient reports persistent left lower quadrant pain which is manageable. She is not sexually active and is not using any contraception.   Past Medical History  Diagnosis Date  . Ovarian cyst   . Fibroid (bleeding) (uterine)    Past Surgical History  Procedure Laterality Date  . Appendectomy    . Wisdom tooth extraction    . Wrist surgery      for cyst   Family History  Problem Relation Age of Onset  . Hypertension Mother   . Diabetes Father   . Hypertension Father   . Cancer Maternal Grandfather     prostate CA    Social History   Social History  . Marital Status: Single    Spouse Name: N/A  . Number of Children: N/A  . Years of Education: N/A   Occupational History  . Not on file.   Social History Main Topics  . Smoking status: Never Smoker   . Smokeless tobacco: Never Used  . Alcohol Use: 0.0 oz/week    0 Standard drinks or equivalent per week     Comment: occassionally  . Drug Use: No  . Sexual Activity: No   Other Topics Concern  . 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   One child, 80 year old daughter.   Lives with her mother and daughter.     Health Maintenance  Topic Date Due  . HIV Screening  01/27/2002  . TETANUS/TDAP  01/27/2006  . INFLUENZA VACCINE  04/11/2016  . PAP SMEAR  12/15/2018       Review of Systems Pertinent items are noted in HPI.   Objective:  Blood pressure 117/77, pulse 64, height 5\' 3"  (1.6 m), weight 282 lb (127.914 kg), last menstrual period 02/24/2016.     GENERAL: Well-developed, well-nourished female in no acute distress. Obese ABDOMEN: Soft, nontender, nondistended. No  organomegaly. PELVIC: Normal external female genitalia. Vagina is pink and rugated.  Normal discharge. Normal appearing cervix. Uterus is normal in size. No adnexal mass or tenderness. EXTREMITIES: No cyanosis, clubbing, or edema, 2+ distal pulses.  02/2016 FINDINGS: Uterus  Measurements: 7.9 x 3.9 x 4.5 cm. There is a 1.5 x 1.2 x 1.7 cm anterior uterine body subserosal myometrial mass likely representing a fibroid.  Endometrium  Thickness: 4.9 mm. No focal abnormality visualized.  Right ovary  Measurements: 2.9 x 1.8 x 2.3 cm. Normal appearance/no adnexal mass.  Left ovary  Measurements: 3.1 x 2.3 x 2.9 cm. The left ovary demonstrates persistent masslike shape and heterogeneous echogenicity, with interspersed coarse calcifications.  Other findings  There is a trace of free pelvic fluid.  IMPRESSION: Persistent heterogeneous masslike appearance of the left ovary, which contains coarse calcifications. The size difference is probably partially accounted for by differences in measurement techniques. Differential diagnosis includes teratoma, or other ovarian tumor such as mucinous or serous tumor versus dystrophic calcifications within normal ovarian stroma. Given the persistence of this abnormality, evaluation with MRI of the pelvis may be considered.   Electronically Signed  By: Fidela Salisbury M.D.  On: 02/15/2016 16:59   Assessment:    Healthy female exam with left ovarian cyst.  Plan:     Results reviewed with the patient. Pelvic MRI ordered Discussed surgical management with laparoscopic ovarian cystectomy, possible oophorectomy. Risks, benefits and alternatives were explained including but not limited to risks of bleeding, infection and damage to adjacent organs. Patient verbalized understanding and all questions were answered  See After Visit Summary for Counseling Recommendations

## 2016-02-29 NOTE — Progress Notes (Signed)
Referred here by Dr. Deborra Medina for ovarian cyst and fibroid noted on ultrasound.  Patient does have discomfort, rates pain and level 6 which is constant.

## 2016-03-02 ENCOUNTER — Encounter (HOSPITAL_COMMUNITY): Payer: Self-pay | Admitting: *Deleted

## 2016-03-09 ENCOUNTER — Ambulatory Visit (HOSPITAL_COMMUNITY): Payer: 59

## 2016-03-13 ENCOUNTER — Ambulatory Visit (HOSPITAL_COMMUNITY)
Admission: RE | Admit: 2016-03-13 | Discharge: 2016-03-13 | Disposition: A | Discharge status: Home / Self Care | Payer: 59 | Source: Ambulatory Visit | Attending: Obstetrics and Gynecology | Admitting: Obstetrics and Gynecology

## 2016-03-13 DIAGNOSIS — N839 Noninflammatory disorder of ovary, fallopian tube and broad ligament, unspecified: Secondary | ICD-10-CM | POA: Diagnosis not present

## 2016-03-13 DIAGNOSIS — D259 Leiomyoma of uterus, unspecified: Secondary | ICD-10-CM | POA: Insufficient documentation

## 2016-03-13 DIAGNOSIS — N83202 Unspecified ovarian cyst, left side: Secondary | ICD-10-CM | POA: Diagnosis not present

## 2016-03-13 MED ORDER — GADOBENATE DIMEGLUMINE 529 MG/ML IV SOLN
20.0000 mL | Freq: Once | INTRAVENOUS | Status: AC | PRN
  Administered 2016-03-13: 20 mL via INTRAVENOUS

## 2016-03-15 ENCOUNTER — Telehealth: Payer: Self-pay | Admitting: *Deleted

## 2016-03-15 NOTE — Telephone Encounter (Signed)
Informed pt of MRI result and pt would like to proceed with scheduled procedure to remove the ovarian mass which is scheduled for 05-02-16.

## 2016-03-15 NOTE — Telephone Encounter (Signed)
-----   Message from Mora Bellman, MD sent at 03/15/2016 11:07 AM EDT ----- Please inform patient of MRI confirming 2 cm ovarian mass which appears to be a fibroid on MRI. Please inform patient that this was seen in her 2011 MRI as well and has not changed in size. This is consistent with a benign process and not cancer.  I am happy to remove it on 05/02/2016 as scheduled  Thanks  Peggy

## 2016-04-19 ENCOUNTER — Encounter (HOSPITAL_COMMUNITY): Payer: Self-pay

## 2016-04-19 ENCOUNTER — Encounter (HOSPITAL_COMMUNITY)
Admission: RE | Admit: 2016-04-19 | Discharge: 2016-04-19 | Disposition: A | Discharge status: Home / Self Care | Payer: 59 | Source: Ambulatory Visit | Attending: Obstetrics and Gynecology | Admitting: Obstetrics and Gynecology

## 2016-04-19 DIAGNOSIS — Z01812 Encounter for preprocedural laboratory examination: Secondary | ICD-10-CM | POA: Insufficient documentation

## 2016-04-19 HISTORY — DX: Other specified health status: Z78.9

## 2016-04-19 LAB — TYPE AND SCREEN
ABO/RH(D): O POS
Antibody Screen: NEGATIVE

## 2016-04-19 LAB — CBC
HCT: 37 % (ref 36.0–46.0)
Hemoglobin: 12.5 g/dL (ref 12.0–15.0)
MCH: 27.8 pg (ref 26.0–34.0)
MCHC: 33.8 g/dL (ref 30.0–36.0)
MCV: 82.2 fL (ref 78.0–100.0)
PLATELETS: 384 10*3/uL (ref 150–400)
RBC: 4.5 MIL/uL (ref 3.87–5.11)
RDW: 14.2 % (ref 11.5–15.5)
WBC: 6.6 10*3/uL (ref 4.0–10.5)

## 2016-04-19 LAB — ABO/RH: ABO/RH(D): O POS

## 2016-04-19 NOTE — Patient Instructions (Addendum)
Your procedure is scheduled on: 05/02/16  Enter through the Main Entrance at :6am Pick up desk phone and dial [phone] and inform us of your arrival.  Please call [phone] if you have any problems the morning of surgery.  Remember: Do not eat food or drink liquids, including water, after midnight:Monday   You may brush your teeth the morning of surgery.    DO NOT wear jewelry, eye make-up, lipstick,body lotion, or dark fingernail polish.  (Polished toes are ok) You may wear deodorant.   Patients discharged on the day of surgery will not be allowed to drive home. Wear loose fitting, comfortable clothes for your ride home.

## 2016-04-20 ENCOUNTER — Telehealth: Payer: Self-pay

## 2016-04-20 NOTE — Telephone Encounter (Signed)
Please call pt to get more information. 

## 2016-04-20 NOTE — Telephone Encounter (Signed)
Pt left /vm; pt thought Dr Deborra Medina was going to refill BC pill to [facility];refill not at pharmacy. I do not see where Dr Deborra Medina has prescribed in past. pt last saw Dr Deborra Medina on 12/15/15 and pt saw GYN on 02/29/16. Unable to reach pt by phone.Please advise.

## 2016-04-21 ENCOUNTER — Other Ambulatory Visit: Payer: Self-pay | Admitting: Internal Medicine

## 2016-04-21 MED ORDER — NORGESTIM-ETH ESTRAD TRIPHASIC 0.18/0.215/0.25 MG-35 MCG PO TABS: 1.0000 | ORAL_TABLET | Freq: Every day | ORAL | 11 refills | Status: DC

## 2016-04-21 NOTE — Telephone Encounter (Signed)
Spoke to pt who confirms Dr Deborra Medina has never written Rx but completed her most recent pap. Pt was referred to GYN for outside issues and is requesting refill of BCP. Rx must come directly from Dr Deborra Medina, as it has never been prescribed by her previously

## 2016-04-21 NOTE — Telephone Encounter (Signed)
I don't mind refilling it. Is she currently taking in and when was her LMP?

## 2016-04-21 NOTE — Telephone Encounter (Signed)
Spoke to pt who states her LMP 8/9 and she is not actively taking BCP. Rx can be sent to pharmacy on file

## 2016-04-21 NOTE — Telephone Encounter (Signed)
Medication refilled

## 2016-05-01 NOTE — H&P (Signed)
Ashley Baldwin is an 30 y.o. female G1P1 with a persistent left adnexal mass and left lower quadrant pain here for diagnostic laparoscopy possible cystectomy/oophorectomy. Patient reports persistent of left lower quadrant pain describing it as a sharp pain that occurs intermittently without any aggravating factors.    Menstrual History:  Patient's last menstrual period was 04/18/2016 (exact date).    Past Medical History:  Diagnosis Date  . Fibroid (bleeding) (uterine)   . Medical history non-contributory   . Ovarian cyst     Past Surgical History:  Procedure Laterality Date  . APPENDECTOMY    . WISDOM TOOTH EXTRACTION    . WRIST SURGERY     for cyst    Family History  Problem Relation Age of Onset  . Hypertension Mother   . Diabetes Father   . Hypertension Father   . Cancer Maternal Grandfather     prostate CA    Social History:  reports that she has never smoked. She has never used smokeless tobacco. She reports that she drinks alcohol. She reports that she does not use drugs.  Allergies: No Known Allergies  Prescriptions Prior to Admission  Medication Sig Dispense Refill Last Dose  . Norgestimate-Ethinyl Estradiol Triphasic (ORTHO TRI-CYCLEN, 28,) 0.18/0.215/0.25 MG-35 MCG tablet Take 1 tablet by mouth daily. 1 Package 11 05/01/2016 at Unknown time    Review of Systems  All other systems reviewed and are negative.   Blood pressure 121/86, pulse 77, temperature 98 F (36.7 C), temperature source Oral, resp. rate 18, last menstrual period 04/18/2016, SpO2 99 %. Physical Exam GENERAL: Well-developed, well-nourished female in no acute distress.  HEENT: Normocephalic, atraumatic. Sclerae anicteric.  NECK: Supple. Normal thyroid.  LUNGS: Clear to auscultation bilaterally.  HEART: Regular rate and rhythm. BREASTS: Symmetric in size. No palpable masses or lymphadenopathy, skin changes, or nipple drainage. ABDOMEN: Soft, nontender, nondistended. No  organomegaly. PELVIC: Deferred to OR EXTREMITIES: No cyanosis, clubbing, or edema, 2+ distal pulses.  Results for orders placed or performed during the hospital encounter of 05/02/16 (from the past 24 hour(s))  Pregnancy, urine     Status: None   Collection Time: 05/02/16  6:00 AM  Result Value Ref Range   Preg Test, Ur NEGATIVE NEGATIVE    No results found. Pelvic MRI 03/2016 Uterus: Measures 8.0 x 4.3 x 5.0 cm. There is a small fibroid in anterior uterine body, measuring 1.4 cm.  Endometrium:  Unremarkable.  Cervix/Vagina:  Appears normal.  Right ovary:  Appears normal.  No mass identified.  Left ovary: Within the left adnexa, there is a mildly heterogeneous, predominately low T2 signal lesion measuring 2.3 x 2.7 x 2.2 cm. This lesion abuts the left ovary and the left lateral wall of the uterus. It demonstrates homogeneous T1 signal without definite macroscopic fat or calcification. It is well-circumscribed. There is homogeneous low level enhancement of the lesion following contrast. The lesion appears mildly hypo intense to surrounding soft tissues on the post-contrast axial and coronal images. The left adnexa is difficult to evaluate on previous MRI due to the patient being pregnant at that time. This lesion was questionably present at that time (best seen on series 5 and 6).  Other: Trace free fluid in the cul-de-sac.  Musculoskeletal: No acute or worrisome osseous findings.  IMPRESSION: 1. Well-circumscribed left adnexal lesion demonstrates low T2 signal and homogeneous low-level enhancement. These features are nonspecific but favor a fibrous lesion. The major differential considerations include an exophytic uterine fibroid, a fibroid in the broad ligament  and a fibrous lesion of the ovary (such as fibroma or fibrothecoma). Lesion appears nonaggressive and was questionably present on previous MRI from 2011. Unless surgical resection is warranted clinically,  MR follow-up in 6-12 months suggested. 2. Small anterior uterine fibroid. The right ovary appears unremarkable.   Electronically Signed   By: Richardean Sale M.D.   On: 03/14/2016 11:19  Assessment/Plan: 30 yo with left adnexal mass and pelvic pain here for surgical intervention - Discussed risks of surgery including but not limited to risks of bleeding, infection and damage to adjacent organs. Patient verbalized understanding and all questions were answered. Patient is consented for a laparoscopic left ovarian cystectomy, possible oophorectomy. - Postoperative instructions also reviewed with the patient  Ashley Baldwin 05/02/2016, 7:08 AM

## 2016-05-02 ENCOUNTER — Ambulatory Visit (HOSPITAL_COMMUNITY): Payer: 59 | Admitting: Anesthesiology

## 2016-05-02 ENCOUNTER — Encounter (HOSPITAL_COMMUNITY)
Admission: RE | Disposition: A | Discharge status: Home / Self Care | Payer: Self-pay | Source: Ambulatory Visit | Attending: Obstetrics and Gynecology

## 2016-05-02 ENCOUNTER — Ambulatory Visit (HOSPITAL_COMMUNITY)
Admission: RE | Admit: 2016-05-02 | Discharge: 2016-05-02 | Disposition: A | Discharge status: Home / Self Care | Payer: 59 | Source: Ambulatory Visit | Attending: Obstetrics and Gynecology | Admitting: Obstetrics and Gynecology

## 2016-05-02 ENCOUNTER — Telehealth: Payer: Self-pay

## 2016-05-02 ENCOUNTER — Encounter (HOSPITAL_COMMUNITY): Payer: Self-pay | Admitting: Registered Nurse

## 2016-05-02 DIAGNOSIS — N736 Female pelvic peritoneal adhesions (postinfective): Secondary | ICD-10-CM

## 2016-05-02 DIAGNOSIS — R1032 Left lower quadrant pain: Secondary | ICD-10-CM | POA: Diagnosis not present

## 2016-05-02 DIAGNOSIS — N858 Other specified noninflammatory disorders of uterus: Secondary | ICD-10-CM | POA: Diagnosis present

## 2016-05-02 DIAGNOSIS — Z6841 Body Mass Index (BMI) 40.0 and over, adult: Secondary | ICD-10-CM | POA: Diagnosis not present

## 2016-05-02 DIAGNOSIS — R102 Pelvic and perineal pain: Secondary | ICD-10-CM | POA: Insufficient documentation

## 2016-05-02 DIAGNOSIS — M25559 Pain in unspecified hip: Secondary | ICD-10-CM

## 2016-05-02 HISTORY — PX: LAPAROSCOPIC SALPINGO OOPHERECTOMY: SHX5927

## 2016-05-02 LAB — PREGNANCY, URINE: PREG TEST UR: NEGATIVE

## 2016-05-02 SURGERY — SALPINGO-OOPHORECTOMY, LAPAROSCOPIC
Anesthesia: General | Site: Abdomen | Laterality: Left

## 2016-05-02 MED ORDER — KETOROLAC TROMETHAMINE 30 MG/ML IJ SOLN
INTRAMUSCULAR | Status: AC
  Administered 2016-05-02: 30 mg via INTRAVENOUS
  Filled 2016-05-02: qty 1

## 2016-05-02 MED ORDER — MIDAZOLAM HCL 2 MG/2ML IJ SOLN
INTRAMUSCULAR | Status: AC
  Filled 2016-05-02: qty 2

## 2016-05-02 MED ORDER — ROCURONIUM BROMIDE 100 MG/10ML IV SOLN
INTRAVENOUS | Status: AC
  Filled 2016-05-02: qty 1

## 2016-05-02 MED ORDER — SCOPOLAMINE 1 MG/3DAYS TD PT72
1.0000 | MEDICATED_PATCH | Freq: Once | TRANSDERMAL | Status: DC
  Administered 2016-05-02: 1.5 mg via TRANSDERMAL

## 2016-05-02 MED ORDER — DEXAMETHASONE SODIUM PHOSPHATE 10 MG/ML IJ SOLN
INTRAMUSCULAR | Status: DC | PRN
  Administered 2016-05-02: 10 mg via INTRAVENOUS

## 2016-05-02 MED ORDER — KETOROLAC TROMETHAMINE 30 MG/ML IJ SOLN
30.0000 mg | Freq: Once | INTRAMUSCULAR | Status: AC
  Administered 2016-05-02: 30 mg via INTRAVENOUS

## 2016-05-02 MED ORDER — HYDROMORPHONE HCL 1 MG/ML IJ SOLN
0.2500 mg | INTRAMUSCULAR | Status: DC | PRN
  Administered 2016-05-02: 0.5 mg via INTRAVENOUS

## 2016-05-02 MED ORDER — MEPERIDINE HCL 25 MG/ML IJ SOLN: 6.2500 mg | INTRAMUSCULAR | Status: DC | PRN

## 2016-05-02 MED ORDER — DEXAMETHASONE SODIUM PHOSPHATE 10 MG/ML IJ SOLN
INTRAMUSCULAR | Status: AC
  Filled 2016-05-02: qty 1

## 2016-05-02 MED ORDER — BUPIVACAINE HCL (PF) 0.25 % IJ SOLN
INTRAMUSCULAR | Status: AC
  Filled 2016-05-02: qty 30

## 2016-05-02 MED ORDER — SCOPOLAMINE 1 MG/3DAYS TD PT72
MEDICATED_PATCH | TRANSDERMAL | Status: AC
  Administered 2016-05-02: 1.5 mg via TRANSDERMAL
  Filled 2016-05-02: qty 1

## 2016-05-02 MED ORDER — LACTATED RINGERS IV SOLN
INTRAVENOUS | Status: DC
  Administered 2016-05-02 (×2): via INTRAVENOUS

## 2016-05-02 MED ORDER — PROPOFOL 10 MG/ML IV BOLUS
INTRAVENOUS | Status: AC
  Filled 2016-05-02: qty 20

## 2016-05-02 MED ORDER — FENTANYL CITRATE (PF) 250 MCG/5ML IJ SOLN
INTRAMUSCULAR | Status: AC
  Filled 2016-05-02: qty 5

## 2016-05-02 MED ORDER — FENTANYL CITRATE (PF) 100 MCG/2ML IJ SOLN
INTRAMUSCULAR | Status: DC | PRN
  Administered 2016-05-02: 100 ug via INTRAVENOUS
  Administered 2016-05-02 (×3): 50 ug via INTRAVENOUS

## 2016-05-02 MED ORDER — ROCURONIUM BROMIDE 100 MG/10ML IV SOLN
INTRAVENOUS | Status: DC | PRN
  Administered 2016-05-02: 50 mg via INTRAVENOUS

## 2016-05-02 MED ORDER — OXYCODONE-ACETAMINOPHEN 5-325 MG PO TABS: 1.0000 | ORAL_TABLET | ORAL | 0 refills | Status: DC | PRN

## 2016-05-02 MED ORDER — IBUPROFEN 600 MG PO TABS: 600.0000 mg | ORAL_TABLET | Freq: Four times a day (QID) | ORAL | 1 refills | Status: DC | PRN

## 2016-05-02 MED ORDER — ONDANSETRON HCL 4 MG/2ML IJ SOLN
INTRAMUSCULAR | Status: AC
  Filled 2016-05-02: qty 2

## 2016-05-02 MED ORDER — LIDOCAINE HCL (CARDIAC) 20 MG/ML IV SOLN
INTRAVENOUS | Status: AC
  Filled 2016-05-02: qty 5

## 2016-05-02 MED ORDER — PROPOFOL 10 MG/ML IV BOLUS
INTRAVENOUS | Status: DC | PRN
  Administered 2016-05-02: 150 mg via INTRAVENOUS

## 2016-05-02 MED ORDER — ONDANSETRON HCL 4 MG/2ML IJ SOLN
INTRAMUSCULAR | Status: DC | PRN
  Administered 2016-05-02: 4 mg via INTRAVENOUS

## 2016-05-02 MED ORDER — ONDANSETRON HCL 4 MG/2ML IJ SOLN: 4.0000 mg | Freq: Once | INTRAMUSCULAR | Status: DC | PRN

## 2016-05-02 MED ORDER — SUGAMMADEX SODIUM 200 MG/2ML IV SOLN
INTRAVENOUS | Status: DC | PRN
  Administered 2016-05-02: 200 mg via INTRAVENOUS

## 2016-05-02 MED ORDER — LIDOCAINE 2% (20 MG/ML) 5 ML SYRINGE
INTRAMUSCULAR | Status: DC | PRN
  Administered 2016-05-02: 100 mg via INTRAVENOUS

## 2016-05-02 MED ORDER — MIDAZOLAM HCL 5 MG/5ML IJ SOLN
INTRAMUSCULAR | Status: DC | PRN
  Administered 2016-05-02: 2 mg via INTRAVENOUS

## 2016-05-02 MED ORDER — SUGAMMADEX SODIUM 200 MG/2ML IV SOLN
INTRAVENOUS | Status: AC
  Filled 2016-05-02: qty 2

## 2016-05-02 MED ORDER — BUPIVACAINE HCL (PF) 0.25 % IJ SOLN
INTRAMUSCULAR | Status: DC | PRN
  Administered 2016-05-02: 20 mL

## 2016-05-02 MED ORDER — HYDROMORPHONE HCL 1 MG/ML IJ SOLN
INTRAMUSCULAR | Status: AC
  Filled 2016-05-02: qty 1

## 2016-05-02 SURGICAL SUPPLY — 27 items
DRSG COVADERM PLUS 2X2 (GAUZE/BANDAGES/DRESSINGS) ×8 IMPLANT
DRSG OPSITE POSTOP 3X4 (GAUZE/BANDAGES/DRESSINGS) IMPLANT
DURAPREP 26ML APPLICATOR (WOUND CARE) ×4 IMPLANT
ELECT REM PT RETURN 9FT ADLT (ELECTROSURGICAL)
ELECTRODE REM PT RTRN 9FT ADLT (ELECTROSURGICAL) IMPLANT
GLOVE BIOGEL PI IND STRL 6.5 (GLOVE) ×2 IMPLANT
GLOVE BIOGEL PI IND STRL 7.0 (GLOVE) ×2 IMPLANT
GLOVE BIOGEL PI INDICATOR 6.5 (GLOVE) ×2
GLOVE BIOGEL PI INDICATOR 7.0 (GLOVE) ×2
GLOVE SURG SS PI 6.0 STRL IVOR (GLOVE) ×4 IMPLANT
GOWN STRL REUS W/TWL LRG LVL3 (GOWN DISPOSABLE) ×8 IMPLANT
LIQUID BAND (GAUZE/BANDAGES/DRESSINGS) ×4 IMPLANT
NS IRRIG 1000ML POUR BTL (IV SOLUTION) ×4 IMPLANT
PACK LAPAROSCOPY BASIN (CUSTOM PROCEDURE TRAY) ×4 IMPLANT
PAD TRENDELENBURG POSITION (MISCELLANEOUS) ×4 IMPLANT
POUCH SPECIMEN RETRIEVAL 10MM (ENDOMECHANICALS) IMPLANT
SET IRRIG TUBING LAPAROSCOPIC (IRRIGATION / IRRIGATOR) IMPLANT
SHEARS HARMONIC ACE PLUS 36CM (ENDOMECHANICALS) IMPLANT
SLEEVE XCEL OPT CAN 5 100 (ENDOMECHANICALS) ×4 IMPLANT
SUT MNCRL AB 4-0 PS2 18 (SUTURE) ×4 IMPLANT
SUT VICRYL 0 UR6 27IN ABS (SUTURE) ×8 IMPLANT
TOWEL OR 17X24 6PK STRL BLUE (TOWEL DISPOSABLE) ×8 IMPLANT
TRAY FOLEY CATH SILVER 14FR (SET/KITS/TRAYS/PACK) ×4 IMPLANT
TROCAR BALLN 12MMX100 BLUNT (TROCAR) ×4 IMPLANT
TROCAR XCEL NON-BLD 11X100MML (ENDOMECHANICALS) IMPLANT
TROCAR XCEL NON-BLD 5MMX100MML (ENDOMECHANICALS) ×4 IMPLANT
WATER STERILE IRR 1000ML POUR (IV SOLUTION) IMPLANT

## 2016-05-02 NOTE — Telephone Encounter (Signed)
Called patient to schedule a postop appt, no answer, no way to leave a voicemail, mailbox is full.

## 2016-05-02 NOTE — Anesthesia Preprocedure Evaluation (Addendum)
Anesthesia Evaluation  Patient identified by MRN, date of birth, ID band Patient awake    Reviewed: Allergy & Precautions, H&P , NPO status , Patient's Chart, lab work & pertinent test results  Airway Mallampati: I  TM Distance: >3 FB Neck ROM: full    Dental  (+) Edentulous Upper, Dental Advidsory Given   Pulmonary    Pulmonary exam normal        Cardiovascular Normal cardiovascular exam     Neuro/Psych    GI/Hepatic   Endo/Other  Morbid obesity  Renal/GU      Musculoskeletal   Abdominal   Peds  Hematology   Anesthesia Other Findings   Reproductive/Obstetrics                           Anesthesia Physical Anesthesia Plan  ASA: II  Anesthesia Plan: General   Post-op Pain Management:    Induction: Intravenous  Airway Management Planned: Oral ETT  Additional Equipment:   Intra-op Plan:   Post-operative Plan: Extubation in OR  Informed Consent: I have reviewed the patients History and Physical, chart, labs and discussed the procedure including the risks, benefits and alternatives for the proposed anesthesia with the patient or authorized representative who has indicated his/her understanding and acceptance.   Dental Advisory Given  Plan Discussed with: CRNA and Surgeon  Anesthesia Plan Comments:       Anesthesia Quick Evaluation cc

## 2016-05-02 NOTE — Discharge Instructions (Signed)
Diagnostic Laparoscopy A diagnostic laparoscopy is a procedure to diagnose diseases in the abdomen. During the procedure, a thin, lighted, pencil-sized instrument called a laparoscope is inserted into the abdomen through an incision. The laparoscope allows your health care provider to look at the organs inside your body. LET [facility] CARE PROVIDER KNOW ABOUT:  Any allergies you have.  All medicines you are taking, including vitamins, herbs, eye drops, creams, and over-the-counter medicines.  Previous problems you or members of your family have had with the use of anesthetics.  Any blood disorders you have.  Previous surgeries you have had.  Medical conditions you have. RISKS AND COMPLICATIONS  Generally, this is a safe procedure. However, problems can occur, which may include:  Infection.  Bleeding.  Damage to other organs.  Allergic reaction to the anesthetics used during the procedure. BEFORE THE PROCEDURE  Do not eat or drink anything after midnight on the night before the procedure or as directed by your health care provider.  Ask your health care provider about:  Changing or stopping your regular medicines.  Taking medicines such as aspirin and ibuprofen. These medicines can thin your blood. Do not take these medicines before your procedure if your health care provider instructs you not to.  Plan to have someone take you home after the procedure. PROCEDURE  You may be given a medicine to help you relax (sedative).  You will be given a medicine to make you sleep (general anesthetic).  Your abdomen will be inflated with a gas. This will make your organs easier to see.  Small incisions will be made in your abdomen.  A laparoscope and other small instruments will be inserted into the abdomen through the incisions.  A tissue sample may be removed from an organ in the abdomen for examination.  The instruments will be removed from the abdomen.  The gas will be  released.  The incisions will be closed with stitches (sutures). AFTER THE PROCEDURE  Your blood pressure, heart rate, breathing rate, and blood oxygen level will be monitored often until the medicines you were given have worn off.   This information is not intended to replace advice given to you by your health care provider. Make sure you discuss any questions you have with your health care provider.   Document Released: 12/04/2000 Document Revised: 05/19/2015 Document Reviewed: 04/10/2014 Elsevier Interactive Patient Education 2016 [place] INSTRUCTIONS: Laparoscopy  The following instructions have been prepared to help you care for yourself upon your return home today.  Wound care:  Do not get the incision wet for the first 24 hours. The incision should be kept clean and dry.  The Band-Aids or dressings may be removed the day after surgery.  Should the incision become sore, red, and swollen after the first week, check with your doctor.  Personal hygiene:  Shower the day after your procedure.  Activity and limitations:  Do NOT drive or operate any equipment today.  Do NOT lift anything more than 15 pounds for 2-3 weeks after surgery.  Do NOT rest in bed all day.  Walking is encouraged. Walk each day, starting slowly with 5-minute walks 3 or 4 times a day. Slowly increase the length of your walks.  Walk up and down stairs slowly.  Do NOT do strenuous activities, such as golfing, playing tennis, bowling, running, biking, weight lifting, gardening, mowing, or vacuuming for 2-4 weeks. Ask your doctor when it is okay to start.  Diet: Eat a light  meal as desired this evening. You may resume your usual diet tomorrow.  Return to work: This is dependent on the type of work you do. For the most part you can return to a desk job within a week of surgery. If you are more active at work, please discuss this with your doctor.  What to expect after your surgery: You  may have a slight burning sensation when you urinate on the first day. You may have a very small amount of blood in the urine. Expect to have a small amount of vaginal discharge/light bleeding for 1-2 weeks. It is not unusual to have abdominal soreness and bruising for up to 2 weeks. You may be tired and need more rest for about 1 week. You may experience shoulder pain for 24-72 hours. Lying flat in bed may relieve it.  You may remove the patch behind your ear on or before Friday 05/05/16. Wash your hands with soap and water after contact with the patch.    Call your doctor for any of the following:  Develop a fever of 100.4 or greater  Inability to urinate 6 hours after discharge from hospital  Severe pain not relieved by pain medications  Persistent of heavy bleeding at incision site  Redness or swelling around incision site after a week  Increasing nausea or vomiting  Patient Signature________________________________________ Nurse Signature_________________________________________

## 2016-05-02 NOTE — Op Note (Signed)
Ashley Baldwin PROCEDURE DATE: 05/02/2016  PREOPERATIVE DIAGNOSIS: left lower quadrant pain POSTOPERATIVE DIAGNOSIS:  PROCEDURE: Diagnostic laparoscopy, lysis of adhesion SURGEON:  Dr. Mora Bellman ASSISTANT: Dr. Clovia Cuff  INDICATIONS: 30 y.o. G1P1 with history of LLQ pelvic pain concerning for endometriosis desiring surgical evaluation.   Please see preoperative notes for further details.  Risks of surgery were discussed with the patient including but not limited to: bleeding which may require transfusion or reoperation; infection which may require antibiotics; injury to bowel, bladder, ureters or other surrounding organs; need for additional procedures including laparotomy; thromboembolic phenomenon, incisional problems and other postoperative/anesthesia complications. Written informed consent was obtained.    FINDINGS:  Small uterus, normal ovaries and fallopian tubes bilaterally.  Adhesions noted in the right anterior abdominal wall, involving small bowel. Thin adhesions noted in the posterior cul de sac involving the uterus. A small white lesion noted in the the anterior cul de sac. Peritoneal biopsies were taken and sent to pathology. No other abdominal/pelvic abnormality.  Normal upper abdomen.  ANESTHESIA:    General INTRAVENOUS FLUIDS: 1000 ml ESTIMATED BLOOD LOSS: less than 25 ml URINE OUTPUT: 100 ml SPECIMENS: Peritoneal biopsies COMPLICATIONS: None immediate  PROCEDURE IN DETAIL:  The patient had sequential compression devices applied to her lower extremities while in the preoperative area.  She was then taken to the operating room where general anesthesia was administered and was found to be adequate.  She was placed in the dorsal lithotomy position, and was prepped and draped in a sterile manner.  A Foley catheter was inserted into her bladder and attached to Jared Cahn drainage and a uterine manipulator was then advanced into the uterus .  After an adequate timeout was  performed, attention was then turned to the patient's abdomen where a 11-mm skin incision was made in the umbilical fold.  The underlying fascia was grasped with kocher clamps and incised. The fascia was tagged with 0-Vicryl. The peritoneum was grasped with kelly clamps and entered sharply with Metzenbaum scissors.  The 10-mm trocar and sleeve were then advanced without difficulty into the abdomen where intraabdominal placement was confirmed by the laparoscope.  A detailed survey of the patient's pelvis and abdomen revealed the findings as mentioned above. 2 5-mm trocar were introduced into the right and left lower quadrant under direct visualization. Lysis of adhesions was performed. Biopsy forceps were used to take peritoneal biopsies.  The operative site was surveyed, and it was found to be hemostatic.  No intraoperative injury to surrounding organs was noted.  The abdomen was desufflated and all instruments were then removed from the patient's abdomen. The uterine manipulator was removed without complications.  All incisions were closed with Dermabond. The patient tolerated the procedures well.  All instruments, needles, and sponge counts were correct x 2. The patient was taken to the recovery room in stable condition.

## 2016-05-02 NOTE — Transfer of Care (Signed)
Immediate Anesthesia Transfer of Care Note  Patient: Ashley Baldwin  Procedure(s) Performed: Procedure(s): LAPAROSCOPIC LEFT OVARIAN CYSTECTOMY (Left) POSSIBLE LAPAROSCOPIC LEFT SALPINGO OOPHORECTOMY (Left)  Patient Location: PACU  Anesthesia Type:General  Level of Consciousness:  sedated, patient cooperative and responds to stimulation  Airway & Oxygen Therapy:Patient Spontanous Breathing and Patient connected to face mask oxgen  Post-op Assessment:  Report given to PACU RN and Post -op Vital signs reviewed and stable  Post vital signs:  Reviewed and stable  Last Vitals:  Vitals:   05/02/16 0619  BP: 121/86  Pulse: 77  Resp: 18  Temp: 123XX123 C    Complications: No apparent anesthesia complications

## 2016-05-02 NOTE — Anesthesia Procedure Notes (Signed)
Procedure Name: Intubation Date/Time: 05/02/2016 7:29 AM Performed by: Talbot Grumbling Pre-anesthesia Checklist: Patient identified, Emergency Drugs available, Suction available and Patient being monitored Patient Re-evaluated:Patient Re-evaluated prior to inductionOxygen Delivery Method: Circle system utilized Preoxygenation: Pre-oxygenation with 100% oxygen Intubation Type: IV induction Ventilation: Mask ventilation without difficulty Laryngoscope Size: Miller and 2 Grade View: Grade I Tube type: Oral Tube size: 7.0 mm Number of attempts: 1 Airway Equipment and Method: Stylet Placement Confirmation: ETT inserted through vocal cords under direct vision,  positive ETCO2 and breath sounds checked- equal and bilateral Secured at: 21 cm Tube secured with: Tape Dental Injury: Teeth and Oropharynx as per pre-operative assessment

## 2016-05-02 NOTE — Anesthesia Postprocedure Evaluation (Signed)
Anesthesia Post Note  Patient: [name]  Procedure(s) Performed: Procedure(s) (LRB): LAPAROSCOPIC BIOPSY ANTERIOR CUL DE [place] (Left)  Patient location during evaluation: PACU Anesthesia Type: General Level of consciousness: awake and alert Pain management: pain level controlled Vital Signs Assessment: post-procedure vital signs reviewed and stable Respiratory status: spontaneous breathing, nonlabored ventilation, respiratory function stable and patient connected to nasal cannula oxygen Cardiovascular status: blood pressure returned to baseline and stable Postop Assessment: no signs of nausea or vomiting Anesthetic complications: no     Last Vitals:  Vitals:   05/02/16 0930 05/02/16 1015  BP: 117/63 118/71  Pulse: 62 64  Resp: 16 16  Temp: 36.6 C 36.9 C    Last Pain:  Vitals:   05/02/16 1015  TempSrc:   PainSc: 2    Pain Goal: Patients Stated Pain Goal: 2 (05/02/16 0930)               Daaiel Starlin DAVID

## 2016-05-09 ENCOUNTER — Encounter (HOSPITAL_COMMUNITY): Payer: Self-pay | Admitting: Obstetrics and Gynecology

## 2016-05-11 ENCOUNTER — Encounter: Payer: 59 | Admitting: Obstetrics and Gynecology

## 2016-06-05 ENCOUNTER — Encounter: Payer: 59 | Admitting: Obstetrics and Gynecology

## 2016-07-19 ENCOUNTER — Other Ambulatory Visit: Payer: Self-pay | Admitting: *Deleted

## 2016-07-19 MED ORDER — NORGESTIM-ETH ESTRAD TRIPHASIC 0.18/0.215/0.25 MG-35 MCG PO TABS: 1.0000 | ORAL_TABLET | Freq: Every day | ORAL | 3 refills | Status: DC

## 2016-07-19 NOTE — Addendum Note (Signed)
Addended by: Modena Nunnery on: 07/19/2016 03:53 PM   Modules accepted: Orders

## 2016-07-22 IMAGING — MR MR PELVIS WO/W CM
4 of 9 series · 18 of 48 positions shown · IV contrast (multihance)
Comparison: Pelvic MRI 01/12/2010. Pelvic ultrasound 02/15/2016 and
12/17/2015.

CLINICAL DATA: Follow up left ovarian lesion. No personal history
of cancer. Previous appendectomy.

EXAM:
MRI PELVIS WITHOUT AND WITH CONTRAST
TECHNIQUE: Multiplanar multisequence MR imaging of the pelvis was performed
both before and after administration of intravenous contrast.
CONTRAST:  20mL MULTIHANCE GADOBENATE DIMEGLUMINE 529 MG/ML IV SOLN

[Series 6: T2 · axial · 5.0mm · 0.47mm/px · z∈[+93,+327]mm · 7 of 40 slices shown (1 of 2)]
[im 1/40]
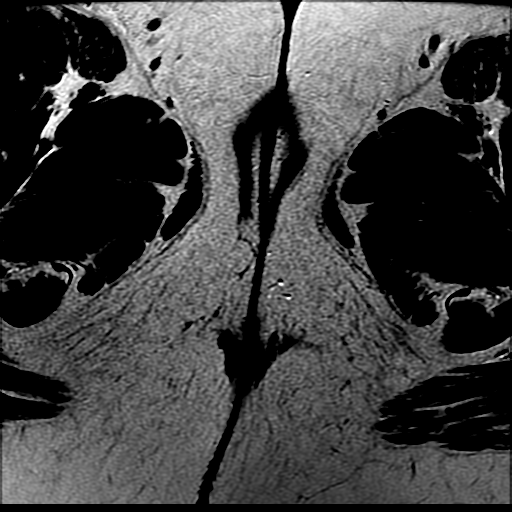
[im 7/40]
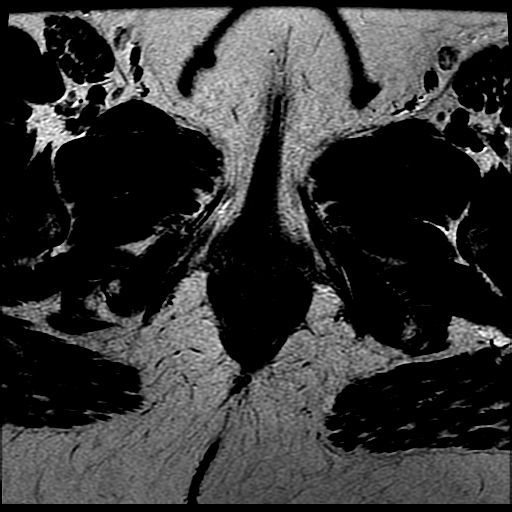
[im 14/40]
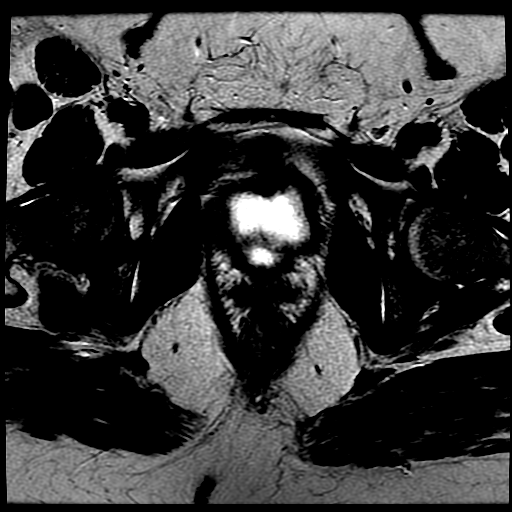
[im 20/40]
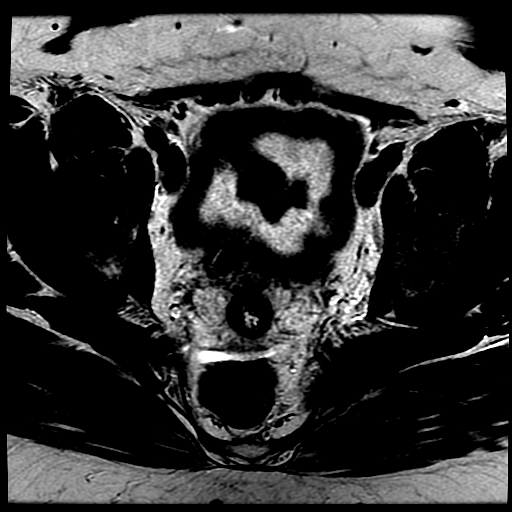
[im 27/40]
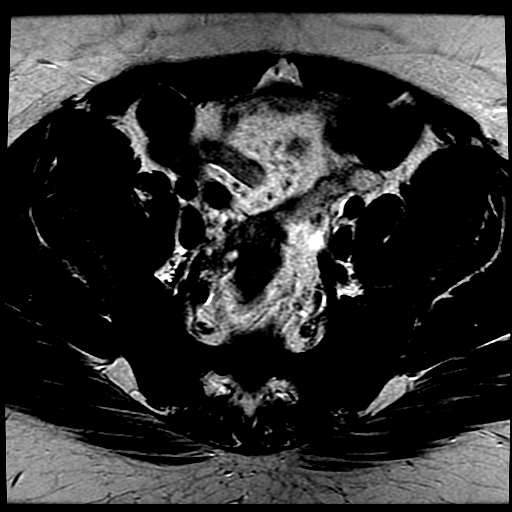
[im 33/40]
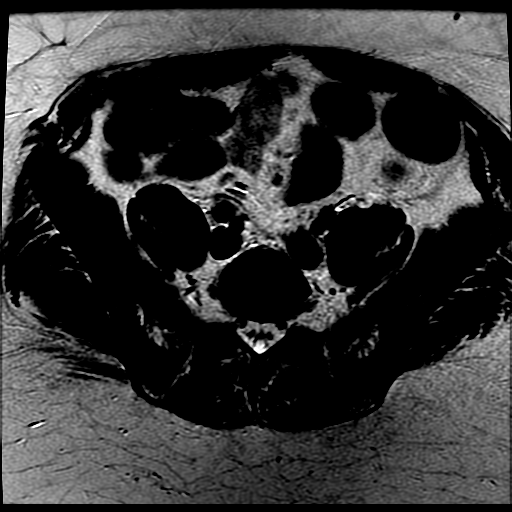
[im 40/40]
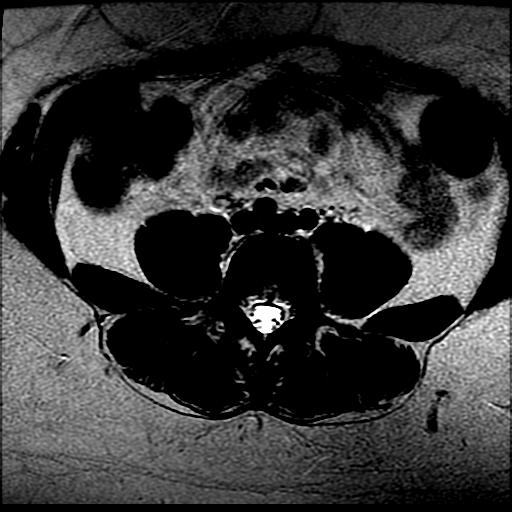

[Series 7: T2 fat-sat · axial · 5.0mm · 0.47mm/px · z∈[+93,+327]mm · 5 of 40 slices shown]
[im 1/40]
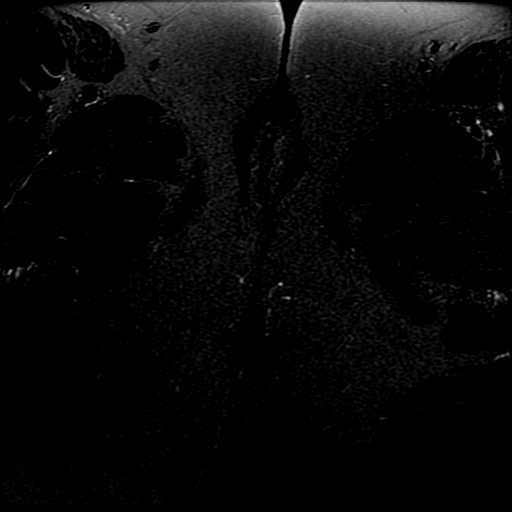
[im 8/40]
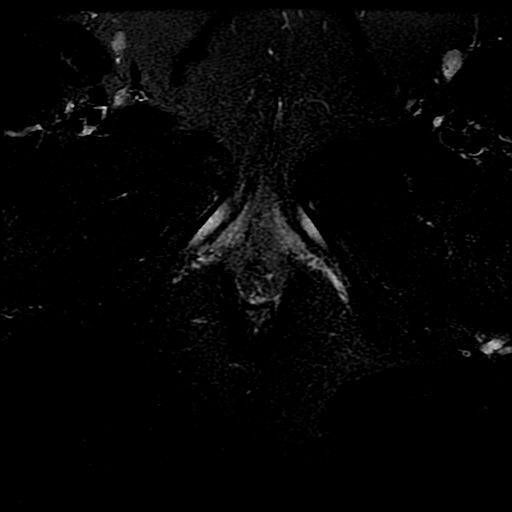
[im 16/40]
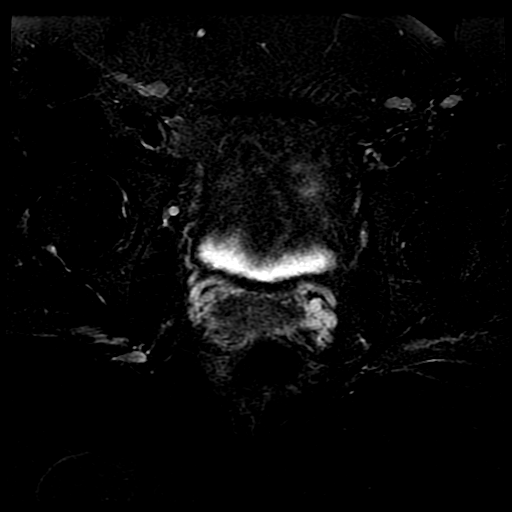
[im 24/40]
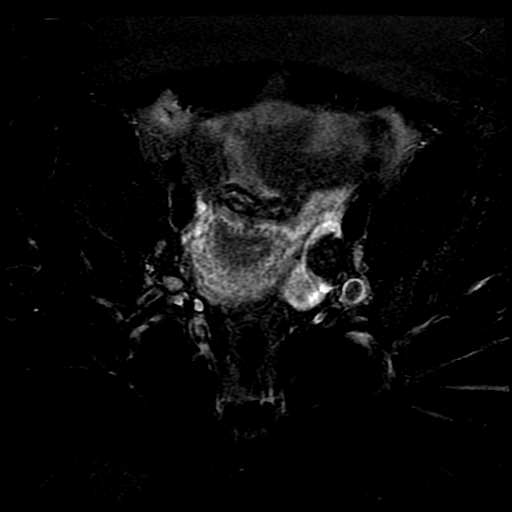
[im 40/40]
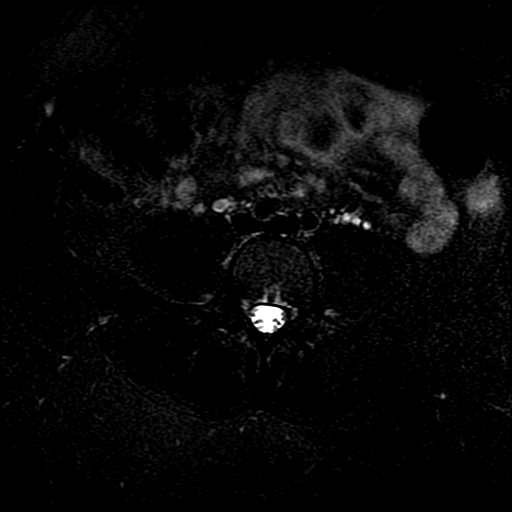

[Series 8: T2 · sagittal · 5.0mm · 0.47mm/px · 3 of 27 slices shown (2 of 2)]
[im 1/27]
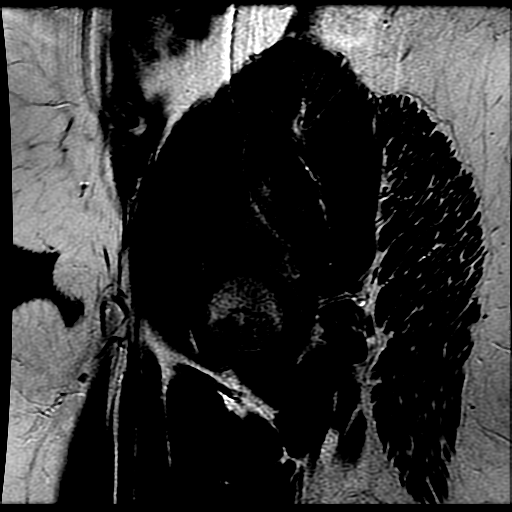
[im 18/27]
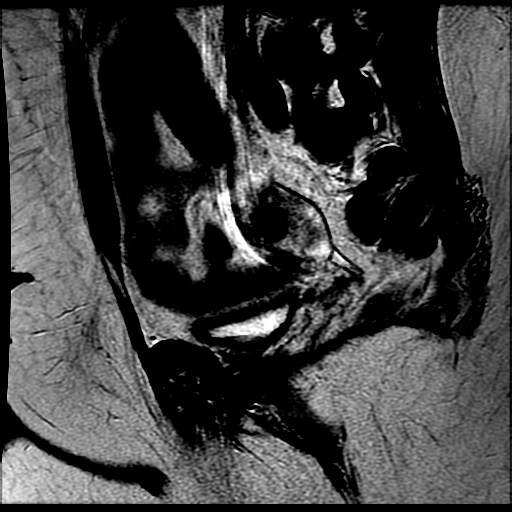
[im 27/27]
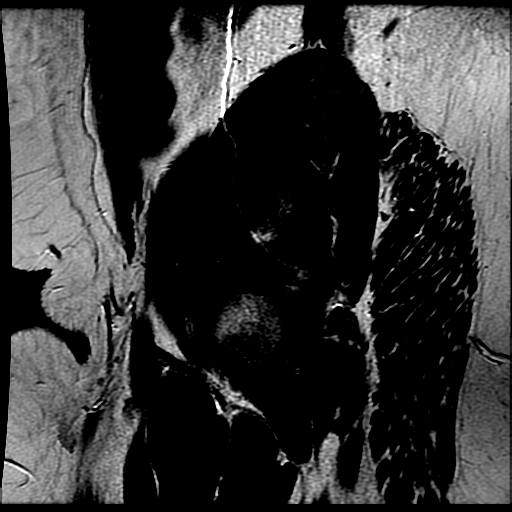

[Series 9: T1 · axial · 5.0mm · 0.47mm/px · z∈[+135,+327]mm · 3 of 40 slices shown]
[im 8/40]
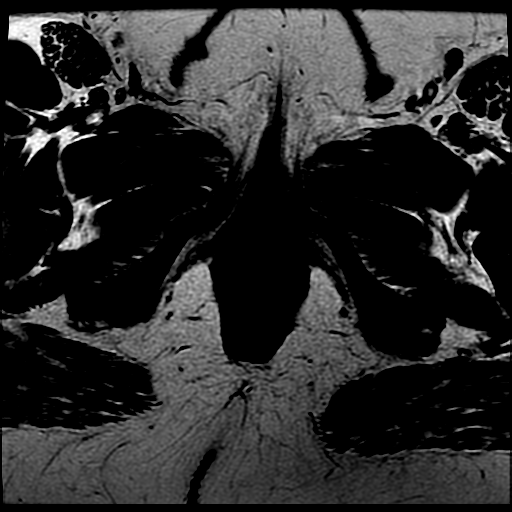
[im 24/40]
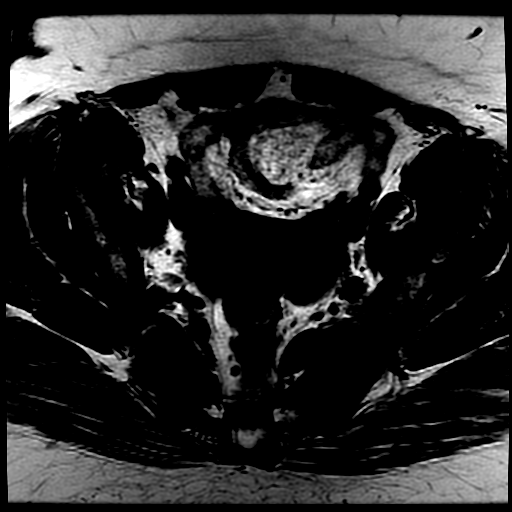
[im 40/40]
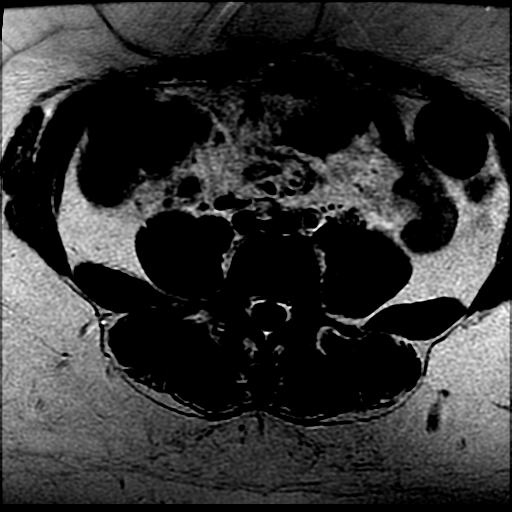

[18 of 48 positions shown; findings below may reference images not displayed]

FINDINGS: Urinary Tract: The visualized distal ureters and bladder appear
unremarkable.

Bowel: No bowel wall thickening, distention or surrounding
inflammation identified within the pelvis.

Vascular/Lymphatic: No enlarged pelvic lymph nodes identified. No
significant vascular findings.

Reproductive:

Uterus: Measures 8.0 x 4.3 x 5.0 cm. There is a small fibroid in
anterior uterine body, measuring 1.4 cm.

Endometrium:  Unremarkable.

Cervix/Vagina:  Appears normal.

Right ovary:  Appears normal.  No mass identified.

Left ovary: Within the left adnexa, there is a mildly heterogeneous,
predominately low T2 signal lesion measuring 2.3 x 2.7 x 2.2 cm.
This lesion abuts the left ovary and the left lateral wall of the
uterus. It demonstrates homogeneous T1 signal without definite
macroscopic fat or calcification. It is well-circumscribed. There is
homogeneous low level enhancement of the lesion following contrast.
The lesion appears mildly hypo intense to surrounding soft tissues
on the post-contrast axial and coronal images. The left adnexa is
difficult to evaluate on previous MRI due to the patient being
pregnant at that time. This lesion was questionably present at that
time (best seen on series 5 and 6).

Other: Trace free fluid in the cul-de-sac.

Musculoskeletal: No acute or worrisome osseous findings.
IMPRESSION: 1. Well-circumscribed left adnexal lesion demonstrates low T2 signal
and homogeneous low-level enhancement. These features are
nonspecific but favor a fibrous lesion. The major differential
considerations include an exophytic uterine fibroid, a fibroid in
the broad ligament and a fibrous lesion of the ovary (such as
fibroma or fibrothecoma). Lesion appears nonaggressive and was
questionably present on previous MRI from 4500. Unless surgical
resection is warranted clinically, MR follow-up in 6-12 months
suggested.
2. Small anterior uterine fibroid. The right ovary appears
unremarkable.

## 2016-07-24 ENCOUNTER — Ambulatory Visit (INDEPENDENT_AMBULATORY_CARE_PROVIDER_SITE_OTHER): Payer: 59 | Admitting: Family Medicine

## 2016-07-24 ENCOUNTER — Encounter: Payer: Self-pay | Admitting: Family Medicine

## 2016-07-24 VITALS — BP 116/68 | HR 83 | Temp 98.0°F | Wt 281.5 lb

## 2016-07-24 DIAGNOSIS — N3000 Acute cystitis without hematuria: Secondary | ICD-10-CM | POA: Diagnosis not present

## 2016-07-24 MED ORDER — CIPROFLOXACIN HCL 500 MG PO TABS: 500.0000 mg | ORAL_TABLET | Freq: Two times a day (BID) | ORAL | 0 refills | Status: DC

## 2016-07-24 NOTE — Progress Notes (Signed)
SUBJECTIVE: Ashley Baldwin is a 30 y.o. female who complains of urinary frequency, urgency and dysuria x 3 days, without flank pain, fever, chills, or abnormal vaginal discharge or bleeding.   Has been taking AZO which is helping with dysuria. Current Outpatient Prescriptions on File Prior to Visit  Medication Sig Dispense Refill  . ibuprofen (ADVIL,MOTRIN) 600 MG tablet Take 1 tablet (600 mg total) by mouth every 6 (six) hours as needed. 30 tablet 1  . Norgestimate-Ethinyl Estradiol Triphasic (ORTHO TRI-CYCLEN, 28,) 0.18/0.215/0.25 MG-35 MCG tablet Take 1 tablet by mouth daily. 3 Package 3  . oxyCODONE-acetaminophen (PERCOCET/ROXICET) 5-325 MG tablet Take 1 tablet by mouth every 4 (four) hours as needed. 20 tablet 0   No current facility-administered medications on file prior to visit.     No Known Allergies  Past Medical History:  Diagnosis Date  . Fibroid (bleeding) (uterine)   . Medical history non-contributory   . Ovarian cyst     Past Surgical History:  Procedure Laterality Date  . APPENDECTOMY    . LAPAROSCOPIC SALPINGO OOPHERECTOMY Left 05/02/2016   Procedure: LAPAROSCOPIC BIOPSY ANTERIOR CUL DE [place];  Surgeon: Mora Bellman, MD;  Location: [facility];  Service: Gynecology;  Laterality: Left;  . WISDOM TOOTH EXTRACTION    . WRIST SURGERY     for cyst    Family History  Problem Relation Age of Onset  . Hypertension Mother   . Diabetes Father   . Hypertension Father   . Cancer Maternal Grandfather     prostate CA    Social History   Social History  . Marital status: Single    Spouse name: N/A  . Number of children: N/A  . Years of education: N/A   Occupational History  . Not on file.   Social History Main Topics  . Smoking status: Never Smoker  . Smokeless tobacco: Never Used  . Alcohol use 0.0 oz/week     Comment: occassionally  . Drug use: No  . Sexual activity: No   Other Topics Concern  . 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   One child, 87 year old daughter.   Lives with her mother and daughter.     The PMH, PSH, Social History, Family History, Medications, and allergies have been reviewed in [facility], and have been updated if relevant.  OBJECTIVE:   BP 116/68   Pulse 83   Temp 98 F (36.7 C) (Oral)   Wt 281 lb 8 oz (127.7 kg)   LMP 07/08/2016   SpO2 98%   BMI 49.87 kg/m  Appears well, in no apparent distress.  Vital signs are normal. The abdomen is soft without tenderness, guarding, mass, rebound or organomegaly. No CVA tenderness or inguinal adenopathy noted. Urine dipstick shows not done- unable to perform as she took AZO.   ASSESSMENT: UTI uncomplicated without evidence of pyelonephritis  PLAN: Treatment per orders - cipro 500 mg twice daily x 3 days, also push fluids, may use Pyridium OTC prn. Call or return to clinic prn if these symptoms worsen or fail to improve as anticipated.Marland Kitchen

## 2016-07-24 NOTE — Progress Notes (Signed)
Pre visit review using our clinic review tool, if applicable. No additional management support is needed unless otherwise documented below in the visit note. 

## 2016-07-24 NOTE — Patient Instructions (Signed)

## 2016-09-18 ENCOUNTER — Other Ambulatory Visit: Payer: Self-pay | Admitting: *Deleted

## 2016-09-18 MED ORDER — NORGESTIM-ETH ESTRAD TRIPHASIC 0.18/0.215/0.25 MG-35 MCG PO TABS: 1.0000 | ORAL_TABLET | Freq: Every day | ORAL | 0 refills | Status: DC

## 2017-09-10 ENCOUNTER — Other Ambulatory Visit: Payer: Self-pay | Admitting: Family Medicine

## 2018-10-09 ENCOUNTER — Ambulatory Visit (INDEPENDENT_AMBULATORY_CARE_PROVIDER_SITE_OTHER): Payer: Commercial Managed Care - PPO | Admitting: Family Medicine

## 2018-10-09 ENCOUNTER — Encounter: Payer: Self-pay | Admitting: Family Medicine

## 2018-10-09 VITALS — BP 132/86 | HR 68 | Temp 98.3°F | Ht 63.0 in | Wt 285.2 lb

## 2018-10-09 DIAGNOSIS — J209 Acute bronchitis, unspecified: Secondary | ICD-10-CM

## 2018-10-09 DIAGNOSIS — R05 Cough: Secondary | ICD-10-CM | POA: Diagnosis not present

## 2018-10-09 DIAGNOSIS — R059 Cough, unspecified: Secondary | ICD-10-CM

## 2018-10-09 LAB — POC INFLUENZA A&B (BINAX/QUICKVUE)
Influenza A, POC: NEGATIVE
Influenza B, POC: NEGATIVE

## 2018-10-09 MED ORDER — BENZONATATE 200 MG PO CAPS: 200.0000 mg | ORAL_CAPSULE | Freq: Three times a day (TID) | ORAL | 1 refills | Status: AC | PRN

## 2018-10-09 MED ORDER — AZITHROMYCIN 250 MG PO TABS: ORAL_TABLET | ORAL | 0 refills | Status: AC

## 2018-10-09 MED ORDER — GUAIFENESIN-CODEINE 100-10 MG/5ML PO SYRP: 5.0000 mL | ORAL_SOLUTION | Freq: Every evening | ORAL | 0 refills | Status: AC | PRN

## 2018-10-09 NOTE — Patient Instructions (Addendum)
Drink lots of fluids  Rest when you can  Take mucinex DM during the day  Then the codeine cough syrup at night  zithromax as directed  Tessalon for cough three times per day   Watch for wheezing or fever and let us know Update if not starting to improve in a week or if worsening    When you feel better get a flu shot

## 2018-10-09 NOTE — Progress Notes (Signed)
Subjective:    Patient ID: Ashley Baldwin, female    DOB: 29-Aug-1986, 33 y.o.   MRN: [id number]  HPI Here for cough/chills and nasal congestion   33 yo pt of Dr Deborra Medina  Symptoms started Saturday Severe sore throat -that is improved   Cough- dry cough most of the time  Mucous is green when she has it  Winded easily  "fizzing" noise -with breathing (chest congestion) Not wheezing   Nasal congestion  Clear d/c  Some hoarseness   Has not taken temp  Not hot occ chills - last 2 d  Does not think she has a fever   Otc:  Store brand of mucinex (unsure if DM)   Results for orders placed or performed in visit on 10/09/18  POC Influenza A&B(BINAX/QUICKVUE)  Result Value Ref Range   Influenza A, POC Negative Negative   Influenza B, POC Negative Negative    Patient Active Problem List   Diagnosis Date Noted  . Acute bronchitis 10/09/2018  . Pain in joint, pelvic region and thigh   . Well woman exam with routine gynecological exam 12/15/2015  . Abdominal pain, chronic, right lower quadrant 12/14/2015  . Severe obesity (BMI >= 40) ([place]) 11/04/2012  . Alopecia 11/04/2012   Past Medical History:  Diagnosis Date  . Fibroid (bleeding) (uterine)   . Medical history non-contributory   . Ovarian cyst    Past Surgical History:  Procedure Laterality Date  . APPENDECTOMY    . LAPAROSCOPIC SALPINGO OOPHERECTOMY Left 05/02/2016   Procedure: LAPAROSCOPIC BIOPSY ANTERIOR CUL DE [place];  Surgeon: Mora Bellman, MD;  Location: [facility];  Service: Gynecology;  Laterality: Left;  . WISDOM TOOTH EXTRACTION    . WRIST SURGERY     for cyst   Social History   Tobacco Use  . Smoking status: Never Smoker  . Smokeless tobacco: Never Used  Substance Use Topics  . Alcohol use: Yes    Alcohol/week: 0.0 standard drinks    Comment: occassionally  . Drug use: No   Family History  Problem Relation Age of Onset  . Hypertension Mother   . Diabetes Father   . Hypertension Father   . Cancer  Maternal Grandfather        prostate CA   No Known Allergies Current Outpatient Medications on File Prior to Visit  Medication Sig Dispense Refill  . TRI-SPRINTEC 0.18/0.215/0.25 MG-35 MCG tablet TAKE 1 TABLET BY MOUTH  DAILY 28 tablet 0   No current facility-administered medications on file prior to visit.      Review of Systems  Constitutional: Positive for appetite change, chills and fatigue. Negative for fever.  HENT: Positive for congestion, postnasal drip, rhinorrhea, sinus pressure, sneezing and sore throat. Negative for ear pain.   Eyes: Negative for pain and discharge.  Respiratory: Positive for cough. Negative for shortness of breath, wheezing and stridor.   Cardiovascular: Negative for chest pain.  Gastrointestinal: Negative for diarrhea, nausea and vomiting.  Genitourinary: Negative for frequency, hematuria and urgency.  Musculoskeletal: Negative for arthralgias and myalgias.  Skin: Negative for rash.  Neurological: Positive for headaches. Negative for dizziness, weakness and light-headedness.  Psychiatric/Behavioral: Negative for confusion and dysphoric mood.       Objective:   Physical Exam Constitutional:      General: She is not in acute distress.    Appearance: Normal appearance. She is well-developed. She is obese. She is ill-appearing. She is not toxic-appearing or diaphoretic.  HENT:     Head: Normocephalic  and atraumatic.     Comments: Nares are injected and congested      Right Ear: Tympanic membrane, ear canal and external ear normal.     Left Ear: Tympanic membrane, ear canal and external ear normal.     Nose: Congestion and rhinorrhea present.     Mouth/Throat:     Mouth: Mucous membranes are moist.     Pharynx: Oropharynx is clear. No oropharyngeal exudate or posterior oropharyngeal erythema.     Comments: Clear pnd  Eyes:     General:        Right eye: No discharge.        Left eye: No discharge.     Conjunctiva/sclera: Conjunctivae normal.      Pupils: Pupils are equal, round, and reactive to light.  Neck:     Musculoskeletal: Normal range of motion and neck supple.  Cardiovascular:     Rate and Rhythm: Normal rate and regular rhythm.     Heart sounds: Normal heart sounds.  Pulmonary:     Effort: Pulmonary effort is normal. No respiratory distress.     Breath sounds: No stridor. Wheezing and rhonchi present. No rales.     Comments: Harsh bs Scattered rhonchi Upper airway sounds  Scant wheeze on forced exp only Chest:     Chest wall: No tenderness.  Lymphadenopathy:     Cervical: No cervical adenopathy.  Skin:    General: Skin is warm and dry.     Capillary Refill: Capillary refill takes less than 2 seconds.     Findings: No rash.  Neurological:     Mental Status: She is alert.     Cranial Nerves: No cranial nerve deficit.  Psychiatric:        Mood and Affect: Mood normal.           Assessment & Plan:   Problem List Items Addressed This Visit      Respiratory   Acute bronchitis    S/p uri  Neg flu screen  Reassuring exam  Will cover with zpack due to severity  AVS: Drink lots of fluids  Rest when you can  Take mucinex DM during the day  Then the codeine cough syrup at night  zithromax as directed  Tessalon for cough three times per day   Watch for wheezing or fever and let us know Update if not starting to improve in a week or if worsening   Meds ordered this encounter  Medications  . azithromycin (ZITHROMAX Z-PAK) 250 MG tablet    Sig: Take 2 pills by mouth today and then 1 pill daily for 4 days    Dispense:  6 tablet    Refill:  0  . benzonatate (TESSALON) 200 MG capsule    Sig: Take 1 capsule (200 mg total) by mouth 3 (three) times daily as needed for cough. Do not bite pill    Dispense:  30 capsule    Refill:  1  . guaiFENesin-codeine (ROBITUSSIN AC) 100-10 MG/5ML syrup    Sig: Take 5-10 mLs by mouth at bedtime as needed for cough.    Dispense:  80 mL    Refill:  0          Other  Visit Diagnoses    Cough    -  Primary   Relevant Orders   POC Influenza A&B(BINAX/QUICKVUE) (Completed)

## 2018-10-10 NOTE — Assessment & Plan Note (Signed)
S/p uri  Neg flu screen  Reassuring exam  Will cover with zpack due to severity  AVS: Drink lots of fluids  Rest when you can  Take mucinex DM during the day  Then the codeine cough syrup at night  zithromax as directed  Tessalon for cough three times per day   Watch for wheezing or fever and let us know Update if not starting to improve in a week or if worsening   Meds ordered this encounter  Medications  . azithromycin (ZITHROMAX Z-PAK) 250 MG tablet    Sig: Take 2 pills by mouth today and then 1 pill daily for 4 days    Dispense:  6 tablet    Refill:  0  . benzonatate (TESSALON) 200 MG capsule    Sig: Take 1 capsule (200 mg total) by mouth 3 (three) times daily as needed for cough. Do not bite pill    Dispense:  30 capsule    Refill:  1  . guaiFENesin-codeine (ROBITUSSIN AC) 100-10 MG/5ML syrup    Sig: Take 5-10 mLs by mouth at bedtime as needed for cough.    Dispense:  80 mL    Refill:  0

## 2019-01-17 ENCOUNTER — Telehealth: Payer: Self-pay | Admitting: Family Medicine

## 2019-01-17 NOTE — Telephone Encounter (Signed)
Called to see if pt still seeing Dr Deborra Medina as her PCP since its been since 2017, left message

## 2019-01-17 NOTE — Telephone Encounter (Signed)
Made in error

## 2020-06-10 ENCOUNTER — Other Ambulatory Visit: Payer: Commercial Managed Care - PPO

## 2020-06-10 DIAGNOSIS — Z20822 Contact with and (suspected) exposure to covid-19: Secondary | ICD-10-CM

## 2020-06-11 LAB — NOVEL CORONAVIRUS, NAA: SARS-CoV-2, NAA: DETECTED — AB

## 2020-06-11 LAB — SARS-COV-2, NAA 2 DAY TAT

## 2020-06-12 ENCOUNTER — Encounter: Payer: Self-pay | Admitting: Physician Assistant

## 2020-06-25 ENCOUNTER — Other Ambulatory Visit: Payer: No Typology Code available for payment source

## 2020-06-25 DIAGNOSIS — Z20822 Contact with and (suspected) exposure to covid-19: Secondary | ICD-10-CM

## 2020-06-28 LAB — NOVEL CORONAVIRUS, NAA: SARS-CoV-2, NAA: NOT DETECTED

## 2023-02-26 ENCOUNTER — Ambulatory Visit: Payer: Managed Care, Other (non HMO) | Admitting: Nurse Practitioner

## 2023-02-26 NOTE — Progress Notes (Deleted)
Hershal Coria Perrin Gens,acting as a Neurosurgeon for Arnette Felts, FNP.,have documented all relevant documentation on the behalf of Arnette Felts, FNP,as directed by  Arnette Felts, FNP while in the presence of Arnette Felts, FNP.  Subjective:  Patient ID: Ashley Baldwin , female    DOB: 02-15-1986 , 37 y.o.   MRN: [id number]  No chief complaint on file.   HPI  Patient presents today to establish care, patient would like to discuss    Past Medical History:  Diagnosis Date  . Fibroid (bleeding) (uterine)   . Medical history non-contributory   . Ovarian cyst      Family History  Problem Relation Age of Onset  . Hypertension Mother   . Diabetes Father   . Hypertension Father   . Cancer Maternal Grandfather        prostate CA     Current Outpatient Medications:  .  azithromycin (ZITHROMAX Z-PAK) 250 MG tablet, Take 2 pills by mouth today and then 1 pill daily for 4 days, Disp: 6 tablet, Rfl: 0 .  benzonatate (TESSALON) 200 MG capsule, Take 1 capsule (200 mg total) by mouth 3 (three) times daily as needed for cough. Do not bite pill, Disp: 30 capsule, Rfl: 1 .  guaiFENesin-codeine (ROBITUSSIN AC) 100-10 MG/5ML syrup, Take 5-10 mLs by mouth at bedtime as needed for cough., Disp: 80 mL, Rfl: 0 .  TRI-SPRINTEC 0.18/0.215/0.25 MG-35 MCG tablet, TAKE 1 TABLET BY MOUTH  DAILY, Disp: 28 tablet, Rfl: 0   No Known Allergies   Review of Systems   There were no vitals filed for this visit. There is no height or weight on file to calculate BMI.  Wt Readings from Last 3 Encounters:  10/09/18 285 lb 4 oz (129.4 kg)  07/24/16 281 lb 8 oz (127.7 kg)  04/19/16 278 lb (126.1 kg)    The ASCVD Risk score (Arnett DK, et al., 2019) failed to calculate for the following reasons:   The 2019 ASCVD risk score is only valid for ages 66 to 61  Objective:  Physical Exam      Assessment And Plan:  1. Establishing care with new doctor, encounter for    No follow-ups on file.  Patient was given opportunity to  ask questions. Patient verbalized understanding of the plan and was able to repeat key elements of the plan. All questions were answered to their satisfaction.  Arnette Felts, FNP  I, Arnette Felts, FNP, have reviewed all documentation for this visit. The documentation on 02/26/23 for the exam, diagnosis, procedures, and orders are all accurate and complete.   IF YOU HAVE BEEN REFERRED TO A SPECIALIST, IT MAY TAKE 1-2 WEEKS TO SCHEDULE/PROCESS THE REFERRAL. IF YOU HAVE NOT HEARD FROM US/SPECIALIST IN TWO WEEKS, PLEASE GIVE [place] A CALL AT [phone].
